# Patient Record
Sex: Female | Born: 1985 | Race: White | Hispanic: No | Marital: Married | Smoking: Never smoker
Health system: Southern US, Community
[De-identification: ages and names within clinical notes are randomized; demographics above are authoritative.]

## PROBLEM LIST (undated history)

## (undated) DIAGNOSIS — R87619 Unspecified abnormal cytological findings in specimens from cervix uteri: Secondary | ICD-10-CM

## (undated) DIAGNOSIS — IMO0002 Reserved for concepts with insufficient information to code with codable children: Secondary | ICD-10-CM

## (undated) DIAGNOSIS — Z8619 Personal history of other infectious and parasitic diseases: Secondary | ICD-10-CM

## (undated) HISTORY — PX: WISDOM TOOTH EXTRACTION: SHX21

## (undated) HISTORY — PX: MANDIBLE SURGERY: SHX707

## (undated) HISTORY — PX: OTHER SURGICAL HISTORY: SHX169

---
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
## 2005-07-16 ENCOUNTER — Other Ambulatory Visit: Admission: RE | Admit: 2005-07-16 | Discharge: 2005-07-16 | Payer: Self-pay | Admitting: Obstetrics and Gynecology

## 2012-12-16 NOTE — L&D Delivery Note (Signed)
[facility] Delivery Note  At 4:11 PM a viable and healthy female Stacy Morales) was delivered via NSVD (Presentation: OA).  APGAR: , ; weight .   Placenta status:intact. Three vessel cord:  with the following complications: none .  Anesthesia:  1% lidocaine Episiotomy:  none Lacerations: Second degree midline Suture Repair: 2.0 vicryl Est. Blood Loss (mL): 400 cc's.   Mom to postpartum.   Baby A to Skin to skin.   Baby B to NA.  Kirkland Hun V 08/26/2013, 5:05 PM

## 2013-03-16 LAB — OB RESULTS CONSOLE ABO/RH: RH Type: POSITIVE

## 2013-03-16 LAB — OB RESULTS CONSOLE HEPATITIS B SURFACE ANTIGEN: Hepatitis B Surface Ag: NEGATIVE

## 2013-03-16 LAB — OB RESULTS CONSOLE ANTIBODY SCREEN: Antibody Screen: NEGATIVE

## 2013-08-24 ENCOUNTER — Telehealth (HOSPITAL_COMMUNITY): Payer: Self-pay | Admitting: *Deleted

## 2013-08-24 ENCOUNTER — Encounter (HOSPITAL_COMMUNITY): Payer: Self-pay | Admitting: *Deleted

## 2013-08-24 NOTE — Telephone Encounter (Signed)
Preadmission screen  

## 2013-08-26 ENCOUNTER — Encounter (HOSPITAL_COMMUNITY): Payer: Self-pay | Admitting: *Deleted

## 2013-08-26 ENCOUNTER — Inpatient Hospital Stay (HOSPITAL_COMMUNITY)
Admission: AD | Admit: 2013-08-26 | Discharge: 2013-08-26 | Disposition: A | Discharge status: Home / Self Care | Payer: Federal, State, Local not specified - PPO | Source: Ambulatory Visit | Attending: Obstetrics and Gynecology | Admitting: Obstetrics and Gynecology

## 2013-08-26 ENCOUNTER — Inpatient Hospital Stay (HOSPITAL_COMMUNITY)
Admission: AD | Admit: 2013-08-26 | Discharge: 2013-08-27 | DRG: 373 | Disposition: A | Discharge status: Home / Self Care | Payer: Federal, State, Local not specified - PPO | Source: Ambulatory Visit | Attending: Obstetrics and Gynecology | Admitting: Obstetrics and Gynecology

## 2013-08-26 DIAGNOSIS — K649 Unspecified hemorrhoids: Secondary | ICD-10-CM | POA: Diagnosis present

## 2013-08-26 DIAGNOSIS — O878 Other venous complications in the puerperium: Secondary | ICD-10-CM | POA: Diagnosis present

## 2013-08-26 DIAGNOSIS — O479 False labor, unspecified: Secondary | ICD-10-CM | POA: Insufficient documentation

## 2013-08-26 HISTORY — DX: Reserved for concepts with insufficient information to code with codable children: IMO0002

## 2013-08-26 HISTORY — DX: Unspecified abnormal cytological findings in specimens from cervix uteri: R87.619

## 2013-08-26 HISTORY — DX: Personal history of other infectious and parasitic diseases: Z86.19

## 2013-08-26 LAB — ABO/RH: ABO/RH(D): O POS

## 2013-08-26 LAB — CBC
HCT: 36.9 % (ref 36.0–46.0)
RDW: 12.8 % (ref 11.5–15.5)
WBC: 17.8 10*3/uL — ABNORMAL HIGH (ref 4.0–10.5)

## 2013-08-26 MED ORDER — LACTATED RINGERS IV SOLN: 500.0000 mL | INTRAVENOUS | Status: DC | PRN

## 2013-08-26 MED ORDER — MEDROXYPROGESTERONE ACETATE 150 MG/ML IM SUSP: 150.0000 mg | INTRAMUSCULAR | Status: DC | PRN

## 2013-08-26 MED ORDER — LACTATED RINGERS IV SOLN: 500.0000 mL | Freq: Once | INTRAVENOUS | Status: DC

## 2013-08-26 MED ORDER — DIPHENHYDRAMINE HCL 50 MG/ML IJ SOLN: 12.5000 mg | INTRAMUSCULAR | Status: DC | PRN

## 2013-08-26 MED ORDER — EPHEDRINE 5 MG/ML INJ
10.0000 mg | INTRAVENOUS | Status: DC | PRN
  Filled 2013-08-26: qty 2

## 2013-08-26 MED ORDER — OXYTOCIN BOLUS FROM INFUSION: 500.0000 mL | INTRAVENOUS | Status: DC

## 2013-08-26 MED ORDER — BENZOCAINE-MENTHOL 20-0.5 % EX AERO
1.0000 "application " | INHALATION_SPRAY | CUTANEOUS | Status: DC | PRN
  Administered 2013-08-26 – 2013-08-27 (×2): 1 via TOPICAL
  Filled 2013-08-26 (×2): qty 56

## 2013-08-26 MED ORDER — TETANUS-DIPHTH-ACELL PERTUSSIS 5-2.5-18.5 LF-MCG/0.5 IM SUSP
0.5000 mL | Freq: Once | INTRAMUSCULAR | Status: AC
  Administered 2013-08-27: 0.5 mL via INTRAMUSCULAR

## 2013-08-26 MED ORDER — ACETAMINOPHEN 325 MG PO TABS: 650.0000 mg | ORAL_TABLET | ORAL | Status: DC | PRN

## 2013-08-26 MED ORDER — OXYTOCIN 10 UNIT/ML IJ SOLN
INTRAMUSCULAR | Status: AC
  Administered 2013-08-26: 10 [IU]
  Filled 2013-08-26: qty 2

## 2013-08-26 MED ORDER — ONDANSETRON HCL 4 MG/2ML IJ SOLN: 4.0000 mg | Freq: Four times a day (QID) | INTRAMUSCULAR | Status: DC | PRN

## 2013-08-26 MED ORDER — ZOLPIDEM TARTRATE 5 MG PO TABS: 5.0000 mg | ORAL_TABLET | Freq: Every evening | ORAL | Status: DC | PRN

## 2013-08-26 MED ORDER — MEASLES, MUMPS & RUBELLA VAC ~~LOC~~ INJ: 0.5000 mL | INJECTION | Freq: Once | SUBCUTANEOUS | Status: DC

## 2013-08-26 MED ORDER — SENNOSIDES-DOCUSATE SODIUM 8.6-50 MG PO TABS
2.0000 | ORAL_TABLET | ORAL | Status: DC
  Administered 2013-08-27: 2 via ORAL

## 2013-08-26 MED ORDER — BUTORPHANOL TARTRATE 1 MG/ML IJ SOLN: 1.0000 mg | INTRAMUSCULAR | Status: DC | PRN

## 2013-08-26 MED ORDER — PHENYLEPHRINE 40 MCG/ML (10ML) SYRINGE FOR IV PUSH (FOR BLOOD PRESSURE SUPPORT)
80.0000 ug | PREFILLED_SYRINGE | INTRAVENOUS | Status: DC | PRN
  Filled 2013-08-26: qty 2

## 2013-08-26 MED ORDER — WITCH HAZEL-GLYCERIN EX PADS
1.0000 "application " | MEDICATED_PAD | CUTANEOUS | Status: DC | PRN
  Administered 2013-08-26: 1 via TOPICAL

## 2013-08-26 MED ORDER — OXYCODONE-ACETAMINOPHEN 5-325 MG PO TABS: 1.0000 | ORAL_TABLET | ORAL | Status: DC | PRN

## 2013-08-26 MED ORDER — ONDANSETRON HCL 4 MG PO TABS
8.0000 mg | ORAL_TABLET | Freq: Once | ORAL | Status: AC
  Administered 2013-08-26: 8 mg via ORAL
  Filled 2013-08-26: qty 1
  Filled 2013-08-26: qty 2

## 2013-08-26 MED ORDER — OXYTOCIN 10 UNIT/ML IJ SOLN: 10.0000 [IU] | Freq: Once | INTRAMUSCULAR | Status: DC

## 2013-08-26 MED ORDER — ONDANSETRON HCL 4 MG PO TABS: 4.0000 mg | ORAL_TABLET | ORAL | Status: DC | PRN

## 2013-08-26 MED ORDER — LANOLIN HYDROUS EX OINT: TOPICAL_OINTMENT | CUTANEOUS | Status: DC | PRN

## 2013-08-26 MED ORDER — IBUPROFEN 600 MG PO TABS
600.0000 mg | ORAL_TABLET | Freq: Four times a day (QID) | ORAL | Status: DC
  Administered 2013-08-27 (×3): 600 mg via ORAL
  Filled 2013-08-26: qty 1

## 2013-08-26 MED ORDER — FENTANYL 2.5 MCG/ML BUPIVACAINE 1/10 % EPIDURAL INFUSION (WH - ANES): 14.0000 mL/h | INTRAMUSCULAR | Status: DC | PRN

## 2013-08-26 MED ORDER — LACTATED RINGERS IV SOLN: INTRAVENOUS | Status: DC

## 2013-08-26 MED ORDER — DIBUCAINE 1 % RE OINT
1.0000 "application " | TOPICAL_OINTMENT | RECTAL | Status: DC | PRN
  Administered 2013-08-26: 1 via RECTAL
  Filled 2013-08-26: qty 28

## 2013-08-26 MED ORDER — INFLUENZA VAC SPLIT QUAD 0.5 ML IM SUSP
0.5000 mL | INTRAMUSCULAR | Status: AC
  Administered 2013-08-27: 0.5 mL via INTRAMUSCULAR

## 2013-08-26 MED ORDER — SIMETHICONE 80 MG PO CHEW: 80.0000 mg | CHEWABLE_TABLET | ORAL | Status: DC | PRN

## 2013-08-26 MED ORDER — ONDANSETRON HCL 4 MG/2ML IJ SOLN: 4.0000 mg | INTRAMUSCULAR | Status: DC | PRN

## 2013-08-26 MED ORDER — LIDOCAINE HCL (PF) 1 % IJ SOLN
30.0000 mL | INTRAMUSCULAR | Status: AC | PRN
  Administered 2013-08-26 (×2): 30 mL via SUBCUTANEOUS
  Filled 2013-08-26 (×2): qty 30

## 2013-08-26 MED ORDER — CITRIC ACID-SODIUM CITRATE 334-500 MG/5ML PO SOLN: 30.0000 mL | ORAL | Status: DC | PRN

## 2013-08-26 MED ORDER — OXYTOCIN 40 UNITS IN LACTATED RINGERS INFUSION - SIMPLE MED
62.5000 mL/h | INTRAVENOUS | Status: DC
  Filled 2013-08-26: qty 1000

## 2013-08-26 MED ORDER — PRENATAL MULTIVITAMIN CH
1.0000 | ORAL_TABLET | Freq: Every day | ORAL | Status: DC
  Administered 2013-08-27: 1 via ORAL
  Filled 2013-08-26: qty 1

## 2013-08-26 MED ORDER — DIPHENHYDRAMINE HCL 25 MG PO CAPS: 25.0000 mg | ORAL_CAPSULE | Freq: Four times a day (QID) | ORAL | Status: DC | PRN

## 2013-08-26 MED ORDER — IBUPROFEN 600 MG PO TABS
600.0000 mg | ORAL_TABLET | Freq: Four times a day (QID) | ORAL | Status: DC | PRN
  Administered 2013-08-26: 600 mg via ORAL
  Filled 2013-08-26 (×4): qty 1

## 2013-08-26 NOTE — Progress Notes (Signed)
Pt instructed by Dr. Stefano Gaul to go walk around in [place] & come back in 2 hours if uc's continue to become more intense, may go home if uc's become less frequent.  Pt verbalizes understanding.

## 2013-08-26 NOTE — MAU Provider Note (Signed)
  History     CSN: [id number]  Arrival date and time: 08/26/13 8295   First Provider Initiated Contact with Patient 08/26/13 [phone]      No chief complaint on file.  HPI Comments: Pt is a G1P0 at [redacted]w[redacted]d that arrives for labor check, ctx since about 2am, now more painful and regular. Denies any VB or LOF, +FM. Pt c/o nausea, desires WB. Doula is pt's mom "Alice"      Past Medical History  Diagnosis Date  . Medical history non-contributory     Past Surgical History  Procedure Laterality Date  . Shoulder laparoscopic surgery      right  . Mandible surgery      Family History  Problem Relation Age of Onset  . Diabetes Maternal Grandfather   . Heart disease Paternal Grandfather   . Stroke Paternal Grandfather   . Alcohol abuse Neg Hx   . Arthritis Neg Hx   . Asthma Neg Hx   . Birth defects Neg Hx   . Cancer Neg Hx   . COPD Neg Hx   . Depression Neg Hx   . Drug abuse Neg Hx   . Early death Neg Hx   . Hearing loss Neg Hx   . Hyperlipidemia Neg Hx   . Hypertension Neg Hx   . Kidney disease Neg Hx   . Learning disabilities Neg Hx   . Mental illness Neg Hx   . Mental retardation Neg Hx   . Miscarriages / Stillbirths Neg Hx   . Vision loss Neg Hx     History  Substance Use Topics  . Smoking status: Never Smoker   . Smokeless tobacco: Never Used  . Alcohol Use: No    Allergies: No Known Allergies  Prescriptions prior to admission  Medication Sig Dispense Refill  . Doxylamine-Pyridoxine (DICLEGIS PO) Take by mouth.      . Multiple Vitamin (MULTIVITAMIN) tablet Take 1 tablet by mouth daily.        Review of Systems  Gastrointestinal: Positive for nausea.  All other systems reviewed and are negative.   Physical Exam   Blood pressure 110/69, pulse 114, temperature 98.4 F (36.9 C), temperature source Oral, resp. rate 20, height 5\' 6"  (1.676 m), weight 160 lb (72.576 kg), last menstrual period 11/09/2012.  Physical Exam  Nursing note and vitals  reviewed. Constitutional: She is oriented to person, place, and time. She appears well-developed and well-nourished.  HENT:  Head: Normocephalic.  Eyes: Pupils are equal, round, and reactive to light.  Neck: Normal range of motion.  Cardiovascular: Normal rate, regular rhythm and normal heart sounds.   Respiratory: Effort normal and breath sounds normal.  GI: Soft. Bowel sounds are normal.  Genitourinary: Vagina normal.  Musculoskeletal: Normal range of motion.  Neurological: She is alert and oriented to person, place, and time. She has normal reflexes.  Skin: Skin is warm and dry.  Psychiatric: She has a normal mood and affect. Her behavior is normal.    MAU Course  Procedures    Assessment and Plan  IUP at [redacted]w[redacted]d Prodrome vs. Early labor FHR reassuring GBS neg  Desires WB   Will give zofran 8mg  PO  Pt to rest/ambulate x1-2hrs and then recheck cervix Will inform Dr Stefano Gaul to take over care   Graeson Nouri M 08/26/2013, 6:49 AM

## 2013-08-26 NOTE — Progress Notes (Signed)
Apgar Scores: 8/9.  Dr. Stefano Gaul

## 2013-08-26 NOTE — MAU Note (Signed)
PT SAYS SHE HAS BEEN HURT BAD SINCE 0150.   VE IN OFFICE YESTERDAY-  1 CM .   DENIES HSV AND MRSA.

## 2013-08-26 NOTE — H&P (Signed)
Admission History and Physical Exam for an Obstetrics Patient  Stacy Morales is a 27 y.o. female, G1P0, at [redacted]w[redacted]d gestation, who presents for labor. She has been followed at the [facility] and Gynecology division of [facility].  Her pregnancy has been complicated by nausea. See history below.  OB History   Grav Para Term Preterm Abortions TAB SAB Ect Mult Living   1               Past Medical History  Diagnosis Date  . Medical history non-contributory     Prescriptions prior to admission  Medication Sig Dispense Refill  . Doxylamine-Pyridoxine (DICLEGIS PO) Take by mouth.      . Multiple Vitamin (MULTIVITAMIN) tablet Take 1 tablet by mouth daily.        Past Surgical History  Procedure Laterality Date  . Shoulder laparoscopic surgery      right  . Mandible surgery      No Known Allergies  Family History: family history includes Diabetes in her maternal grandfather; Heart disease in her paternal grandfather; Stroke in her paternal grandfather. There is no history of Alcohol abuse, Arthritis, Asthma, Birth defects, Cancer, COPD, Depression, Drug abuse, Early death, Hearing loss, Hyperlipidemia, Hypertension, Kidney disease, Learning disabilities, Mental illness, Mental retardation, Miscarriages / Stillbirths, or Vision loss.  Social History:  reports that she has never smoked. She has never used smokeless tobacco. She reports that she does not drink alcohol or use illicit drugs.  Review of systems: Normal pregnancy complaints.  Admission Physical Exam:   Body mass index is 25.84 kg/(m^2).  Blood pressure 119/79, pulse 64, temperature 99 F (37.2 C), temperature source Oral, resp. rate 18, height 5\' 6"  (1.676 m), weight 160 lb (72.576 kg), last menstrual period 11/09/2012.  HEENT:                 Within normal limits Chest:                   Clear Heart:                    Regular rate and rhythm Abdomen:             Gravid and  nontender Extremities:          Grossly normal Neurologic exam: Grossly normal CX:                       4 cm by the MAU nurse  NST: Cat 1  Prenatal labs: ABO, Rh:             O/Positive/-- (04/01 0000) HBsAg:                 Negative (04/01 0000)  HIV:                       Non-reactive (04/01 0000)  GBS:                     Negative (09/09 0000) Antibody:              Negative (04/01 0000) Rubella:                 Immune RPR:                    Nonreactive (04/01 0000)      Prenatal Transfer Tool  Maternal Diabetes:  No Genetic Screening: Declined Maternal Ultrasounds/Referrals: Normal Fetal Ultrasounds or other Referrals:  Other:  None Maternal Substance Abuse:  No Significant Maternal Medications:  None Significant Maternal Lab Results:  None Other Comments:  None  Assessment:  [redacted]w[redacted]d gestation  Labor  Plan:  Water birth.   Ailsa Mireles V 08/26/2013, 1:12 PM

## 2013-08-26 NOTE — MAU Note (Signed)
Pt here earlier for labor check. Ctx stronger closer together. Pt more uncomfortable

## 2013-08-26 NOTE — Consult Note (Signed)
DATE: 08/26/2013  Maternity Admissions Unit Consult Exam for an Obstetrics Patient  Ms. Stacy Morales is a 27 y.o. female, G1P0, at [redacted]w[redacted]d gestation, who presents for evaluation of labor. She has been followed at the [facility] and Gynecology division of [facility]. The patient has had irregular contractions. Her cervix was 1 cm dilated when evaluated this morning. See history below.  OB History   Grav Para Term Preterm Abortions TAB SAB Ect Mult Living   1               Past Medical History  Diagnosis Date  . Medical history non-contributory     Prescriptions prior to admission  Medication Sig Dispense Refill  . Doxylamine-Pyridoxine (DICLEGIS PO) Take by mouth.      . Multiple Vitamin (MULTIVITAMIN) tablet Take 1 tablet by mouth daily.        Past Surgical History  Procedure Laterality Date  . Shoulder laparoscopic surgery      right  . Mandible surgery      No Known Allergies  Family History: family history includes Diabetes in her maternal grandfather; Heart disease in her paternal grandfather; Stroke in her paternal grandfather. There is no history of Alcohol abuse, Arthritis, Asthma, Birth defects, Cancer, COPD, Depression, Drug abuse, Early death, Hearing loss, Hyperlipidemia, Hypertension, Kidney disease, Learning disabilities, Mental illness, Mental retardation, Miscarriages / Stillbirths, or Vision loss.  Social History:  reports that she has never smoked. She has never used smokeless tobacco. She reports that she does not drink alcohol or use illicit drugs.  Review of systems: Normal pregnancy complaints.  Admission Physical Exam:  Dilation: 2 Effacement (%): 50 Station: -3 Exam by:: Dr. Stefano Gaul Body mass index is 25.84 kg/(m^2).  Blood pressure 110/69, pulse 114, temperature 98.4 F (36.9 C), temperature source Oral, resp. rate 20, height 5\' 6"  (1.676 m), weight 160 lb (72.576 kg), last menstrual period  11/09/2012.   Prenatal labs: ABO, Rh:             O/Positive/-- (04/01 0000) Antibody:              Negative (04/01 0000) Rubella:                  RPR:                    Nonreactive (04/01 0000)  HBsAg:                 Negative (04/01 0000)  HIV:                       Non-reactive (04/01 0000)  GBS:                     Negative (09/09 0000)  NST: Category 1; Contractions: Irregular .   Assessment:  [redacted]w[redacted]d gestation  Contractions  Plan:  The patient will ambulate for several hours. She will then return for reevaluation. The patient should call for bleeding or other difficulties.   Luz Mares V 08/26/2013, 9:14 AM

## 2013-08-27 LAB — CBC
HCT: 32.9 % — ABNORMAL LOW (ref 36.0–46.0)
MCHC: 36.8 g/dL — ABNORMAL HIGH (ref 30.0–36.0)
MCV: 92.7 fL (ref 78.0–100.0)
Platelets: 194 10*3/uL (ref 150–400)
RDW: 13 % (ref 11.5–15.5)
WBC: 14.6 10*3/uL — ABNORMAL HIGH (ref 4.0–10.5)

## 2013-08-27 MED ORDER — IBUPROFEN 600 MG PO TABS: 600.0000 mg | ORAL_TABLET | Freq: Four times a day (QID) | ORAL | Status: DC | PRN

## 2013-08-27 MED ORDER — OXYCODONE-ACETAMINOPHEN 5-325 MG PO TABS: 1.0000 | ORAL_TABLET | Freq: Four times a day (QID) | ORAL | Status: DC | PRN

## 2013-08-27 MED ORDER — HYDROCORTISONE ACE-PRAMOXINE 1-1 % RE FOAM: 1.0000 | Freq: Two times a day (BID) | RECTAL | Status: DC

## 2013-08-27 NOTE — Discharge Summary (Signed)
Obstetric Discharge Summary Reason for Admission: onset of labor Prenatal Procedures: ultrasound Intrapartum Procedures: spontaneous vaginal delivery Postpartum Procedures: none Complications-Operative and Postpartum: none Hemoglobin  Date Value Range Status  08/27/2013 12.1  12.0 - 15.0 g/dL Final     HCT  Date Value Range Status  08/27/2013 32.9* 36.0 - 46.0 % Final   Pt would like discharge today if baby is discharged which she anticipates.  Pt is doing well.  Only complaint is about her hemorrhoids.  She requests proctofoam.  Rx to be given.  Pt is BF'ing and declines BC secondary to the "difficulty" they had with conceiving.  Physical Exam:  General: alert and no distress Lochia: appropriate Uterine Fundus: firm Incision: dehiscence present n/a DVT Evaluation: No evidence of DVT seen on physical exam. Hemorrhoids are soft and minimal tenderness, does not have the appearance of thrombosis currently  Discharge Diagnoses: Term Pregnancy-delivered  Discharge Information: Date: 08/27/2013 Activity: pelvic rest Diet: routine Medications: Ibuprofen, Percocet and Proctofoam HC Condition: stable Instructions: refer to practice specific booklet Discharge to: home Follow-up Information   Follow up with Janine Limbo, MD In 6 weeks. (for post partum visit)    Specialty:  Obstetrics and Gynecology   Contact information:   [address] [place] [address] [phone]       Newborn Data: Live born female  Birth Weight: 9 lb 1.2 oz (4115 g) APGAR: 8, 9  Home with mother.  Purcell Nails 08/27/2013, 12:13 PM

## 2013-09-01 ENCOUNTER — Inpatient Hospital Stay (HOSPITAL_COMMUNITY): Admission: RE | Admit: 2013-09-01 | Payer: Federal, State, Local not specified - PPO | Source: Ambulatory Visit

## 2014-09-15 LAB — OB RESULTS CONSOLE HEPATITIS B SURFACE ANTIGEN: Hepatitis B Surface Ag: NEGATIVE

## 2014-09-15 LAB — OB RESULTS CONSOLE HIV ANTIBODY (ROUTINE TESTING): HIV: NONREACTIVE

## 2014-09-15 LAB — OB RESULTS CONSOLE RPR: RPR: NONREACTIVE

## 2014-09-15 LAB — OB RESULTS CONSOLE GC/CHLAMYDIA
CHLAMYDIA, DNA PROBE: NEGATIVE
GC PROBE AMP, GENITAL: NEGATIVE

## 2014-09-15 LAB — OB RESULTS CONSOLE RUBELLA ANTIBODY, IGM: RUBELLA: IMMUNE

## 2014-09-15 LAB — OB RESULTS CONSOLE ABO/RH: RH Type: POSITIVE

## 2014-09-15 LAB — OB RESULTS CONSOLE ANTIBODY SCREEN: Antibody Screen: NEGATIVE

## 2014-10-17 ENCOUNTER — Encounter (HOSPITAL_COMMUNITY): Payer: Self-pay | Admitting: *Deleted

## 2014-12-16 NOTE — L&D Delivery Note (Signed)
Delivery Note At 6:25 AM a viable female "Stacy Morales" was delivered via Vaginal, Spontaneous Delivery (Presentation: Occiput Anterior, restituting to LOA). APGARS: 8, 9; weight pending.   Placenta status: Intact, Spontaneous.  Cord: 3 vessels with the following complications: Marginal insertion.  Cord pH: NA  Anesthesia: Local for repair Episiotomy: None Lacerations: 2nd degree Suture Repair: 3.0 vicryl and SH Est. Blood Loss (mL): 280  Mom to postpartum.  Baby to Couplet care / Skin to Skin.  Mom plans to breastfeed.  Declines contraception.  Sherre ScarletWILLIAMS, Otisha Spickler 04/14/2015, 7:31 AM

## 2015-03-14 LAB — OB RESULTS CONSOLE GBS: GBS: NEGATIVE

## 2015-04-14 ENCOUNTER — Encounter (INDEPENDENT_AMBULATORY_CARE_PROVIDER_SITE_OTHER): Payer: Self-pay

## 2015-04-14 ENCOUNTER — Inpatient Hospital Stay (HOSPITAL_COMMUNITY)
Admission: AD | Admit: 2015-04-14 | Discharge: 2015-04-15 | DRG: 774 | Disposition: A | Discharge status: Home / Self Care | Payer: BLUE CROSS/BLUE SHIELD | Source: Ambulatory Visit | Attending: Obstetrics and Gynecology | Admitting: Obstetrics and Gynecology

## 2015-04-14 ENCOUNTER — Encounter (HOSPITAL_COMMUNITY): Payer: Self-pay | Admitting: *Deleted

## 2015-04-14 DIAGNOSIS — O2243 Hemorrhoids in pregnancy, third trimester: Secondary | ICD-10-CM | POA: Diagnosis present

## 2015-04-14 DIAGNOSIS — O4403 Placenta previa specified as without hemorrhage, third trimester: Secondary | ICD-10-CM | POA: Diagnosis present

## 2015-04-14 DIAGNOSIS — O9952 Diseases of the respiratory system complicating childbirth: Secondary | ICD-10-CM | POA: Diagnosis present

## 2015-04-14 DIAGNOSIS — Z3A40 40 weeks gestation of pregnancy: Secondary | ICD-10-CM | POA: Diagnosis present

## 2015-04-14 DIAGNOSIS — Z8619 Personal history of other infectious and parasitic diseases: Secondary | ICD-10-CM

## 2015-04-14 DIAGNOSIS — Z8742 Personal history of other diseases of the female genital tract: Secondary | ICD-10-CM

## 2015-04-14 DIAGNOSIS — S3023XA Contusion of vagina and vulva, initial encounter: Secondary | ICD-10-CM

## 2015-04-14 DIAGNOSIS — K645 Perianal venous thrombosis: Secondary | ICD-10-CM

## 2015-04-14 DIAGNOSIS — J302 Other seasonal allergic rhinitis: Secondary | ICD-10-CM | POA: Diagnosis present

## 2015-04-14 DIAGNOSIS — O480 Post-term pregnancy: Secondary | ICD-10-CM | POA: Diagnosis present

## 2015-04-14 DIAGNOSIS — IMO0002 Reserved for concepts with insufficient information to code with codable children: Secondary | ICD-10-CM

## 2015-04-14 LAB — CBC
HCT: 37.9 % (ref 36.0–46.0)
HEMOGLOBIN: 13.7 g/dL (ref 12.0–15.0)
MCH: 33.9 pg (ref 26.0–34.0)
MCHC: 36.1 g/dL — ABNORMAL HIGH (ref 30.0–36.0)
MCV: 93.8 fL (ref 78.0–100.0)
Platelets: 204 10*3/uL (ref 150–400)
RBC: 4.04 MIL/uL (ref 3.87–5.11)
RDW: 13.1 % (ref 11.5–15.5)
WBC: 15.1 10*3/uL — ABNORMAL HIGH (ref 4.0–10.5)

## 2015-04-14 LAB — TYPE AND SCREEN
ABO/RH(D): O POS
Antibody Screen: NEGATIVE

## 2015-04-14 MED ORDER — LACTATED RINGERS IV SOLN: 500.0000 mL | INTRAVENOUS | Status: DC | PRN

## 2015-04-14 MED ORDER — OXYTOCIN BOLUS FROM INFUSION: 500.0000 mL | INTRAVENOUS | Status: DC

## 2015-04-14 MED ORDER — LACTATED RINGERS IV SOLN
INTRAVENOUS | Status: DC
Start: 2015-04-14 — End: 2015-04-15

## 2015-04-14 MED ORDER — ZOLPIDEM TARTRATE 5 MG PO TABS: 5.0000 mg | ORAL_TABLET | Freq: Every evening | ORAL | Status: DC | PRN

## 2015-04-14 MED ORDER — IBUPROFEN 600 MG PO TABS
600.0000 mg | ORAL_TABLET | Freq: Four times a day (QID) | ORAL | Status: DC | PRN
  Administered 2015-04-14: 600 mg via ORAL
  Filled 2015-04-14: qty 1

## 2015-04-14 MED ORDER — NALBUPHINE HCL 10 MG/ML IJ SOLN: 5.0000 mg | INTRAMUSCULAR | Status: DC | PRN

## 2015-04-14 MED ORDER — TETANUS-DIPHTH-ACELL PERTUSSIS 5-2.5-18.5 LF-MCG/0.5 IM SUSP: 0.5000 mL | Freq: Once | INTRAMUSCULAR | Status: DC

## 2015-04-14 MED ORDER — DIBUCAINE 1 % RE OINT: 1.0000 "application " | TOPICAL_OINTMENT | RECTAL | Status: DC | PRN

## 2015-04-14 MED ORDER — SIMETHICONE 80 MG PO CHEW: 80.0000 mg | CHEWABLE_TABLET | ORAL | Status: DC | PRN

## 2015-04-14 MED ORDER — LIDOCAINE HCL (PF) 1 % IJ SOLN
30.0000 mL | INTRAMUSCULAR | Status: DC | PRN
  Filled 2015-04-14: qty 30

## 2015-04-14 MED ORDER — SENNOSIDES-DOCUSATE SODIUM 8.6-50 MG PO TABS: 2.0000 | ORAL_TABLET | ORAL | Status: DC

## 2015-04-14 MED ORDER — OXYTOCIN 10 UNIT/ML IJ SOLN
INTRAMUSCULAR | Status: AC
  Administered 2015-04-14: 10 [IU] via INTRAMUSCULAR
  Filled 2015-04-14: qty 1

## 2015-04-14 MED ORDER — OXYCODONE-ACETAMINOPHEN 5-325 MG PO TABS: 2.0000 | ORAL_TABLET | ORAL | Status: DC | PRN

## 2015-04-14 MED ORDER — PRENATAL MULTIVITAMIN CH: 1.0000 | ORAL_TABLET | Freq: Every day | ORAL | Status: DC

## 2015-04-14 MED ORDER — ACETAMINOPHEN 325 MG PO TABS: 650.0000 mg | ORAL_TABLET | ORAL | Status: DC | PRN

## 2015-04-14 MED ORDER — LANOLIN HYDROUS EX OINT: TOPICAL_OINTMENT | CUTANEOUS | Status: DC | PRN

## 2015-04-14 MED ORDER — ONDANSETRON HCL 4 MG/2ML IJ SOLN: 4.0000 mg | INTRAMUSCULAR | Status: DC | PRN

## 2015-04-14 MED ORDER — OXYTOCIN 40 UNITS IN LACTATED RINGERS INFUSION - SIMPLE MED
62.5000 mL/h | INTRAVENOUS | Status: DC
Start: 2015-04-14 — End: 2015-04-15

## 2015-04-14 MED ORDER — OXYCODONE-ACETAMINOPHEN 5-325 MG PO TABS: 1.0000 | ORAL_TABLET | ORAL | Status: DC | PRN

## 2015-04-14 MED ORDER — ONDANSETRON HCL 4 MG/2ML IJ SOLN: 4.0000 mg | Freq: Four times a day (QID) | INTRAMUSCULAR | Status: DC | PRN

## 2015-04-14 MED ORDER — CITRIC ACID-SODIUM CITRATE 334-500 MG/5ML PO SOLN: 30.0000 mL | ORAL | Status: DC | PRN

## 2015-04-14 MED ORDER — DIPHENHYDRAMINE HCL 25 MG PO CAPS: 25.0000 mg | ORAL_CAPSULE | Freq: Four times a day (QID) | ORAL | Status: DC | PRN

## 2015-04-14 MED ORDER — FLEET ENEMA 7-19 GM/118ML RE ENEM: 1.0000 | ENEMA | RECTAL | Status: DC | PRN

## 2015-04-14 MED ORDER — ONDANSETRON HCL 4 MG PO TABS: 4.0000 mg | ORAL_TABLET | ORAL | Status: DC | PRN

## 2015-04-14 MED ORDER — LIDOCAINE HCL (PF) 1 % IJ SOLN
INTRAMUSCULAR | Status: AC
  Administered 2015-04-14: 30 mL
  Filled 2015-04-14: qty 30

## 2015-04-14 MED ORDER — BENZOCAINE-MENTHOL 20-0.5 % EX AERO: 1.0000 "application " | INHALATION_SPRAY | CUTANEOUS | Status: DC | PRN

## 2015-04-14 MED ORDER — WITCH HAZEL-GLYCERIN EX PADS: 1.0000 "application " | MEDICATED_PAD | CUTANEOUS | Status: DC | PRN

## 2015-04-14 NOTE — H&P (Signed)
Stacy Morales is a 29 y.o. female, G2P1001 at 40.3 weeks admitted in active labor.   Pt desires NCB and has been ambulating by choice for ~ 2 1/2 hrs. Just prior to admission, she reported an increase in contraction pattern - now more frequent with greater intensity. Reports +FM. Denies VB or LOF.  Patient Active Problem List   Diagnosis Date Noted  . Thrombosed external hemorrhoid 04/14/2015  . Marginal insertion of umbilical cord 04/14/2015  . Perineal hematoma - after first delivery 04/14/2015  . NSVD (normal spontaneous vaginal delivery) 04/14/2015  . Normal labor 04/14/2015  . Seasonal allergies 04/14/2015  . History of abnormal cervical Pap smear in 2013 - normal colpo 04/14/2015  . 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 LGA (large for gestational age) fetus with first baby (9 lbs 1 oz) 04/14/2015    History of present pregnancy: Patient entered care at 12.2 weeks.   EDC of 04/11/15 was established by uncertain LMP, yet c/w 12.1 wk sono.   Anatomy scan:  20.2 weeks, with normal anatomy, anterior placenta with marginal insertion of cord - repeat at 28 wks.   Additional US evaluations: 12.2 wks - dating: Anteverted uterus. [place] [id number] IUP. Ovaries and adnexas WNLs. Dates concordant. 28.3 wks for growth and MI of cord: Vertex. Anterior placenta. Normal fluid. AFI of 14.7 cm = 50%tile. Placenta cord insertion seen, appears stable - measurement of placenta edge to uterine wall = 2.9 cm. EFW 46th%tile. Cvx closed.   36.6 wks for growth (S<D) and MI of cord: Vertex. Anterior placenta. Placental cord insertion not seen on this study. Normal fluid. AFI 13.7 cm = 50%tile. EFW 34th%tile. Cervix not measured.   Significant prenatal events: Unplanned pregnancy but very much desired. Used breastfeeding as means of contraception, became pregnant upon discontinuation. Nausea in first trimester relieved by Diclegis. Yeast infection also in first  trimester - treated/relieved w/ Terazol cream. Round ligament pain in 2nd trimester - reviewed as a common discomfort of pregnancy; conservative measures also reviewed. Struggled w/ canker sores on tongue at ~ 20 wks - used Triamcinolone ointment prescribed by Dentist. Marginal insertion of cord - elected as few ultrasounds as possible due to high u/s copay - accepted risk of potentially missing IUGR. Contractions at 36+ wks - cvx 0/20%/-3. Seasonal allergies treated w/ Zyrtec. Partially thrombosed hemorrhoid - treated w/ Analpram-HC and Proctofoam. Contractions at 39 wks - membranes swept; cvx 1/50/-1. TWG 18 lbs; normal BMI. Received flu vaccine on 10/24/14. Declined Tdap.   Last evaluation: Office 04/12/15 @ 40.1 wks by Dr. Estanislado Morales. Cvx 1/50/-2. BP 120/58.  OB History    Gravida Para Term Preterm AB TAB SAB Ectopic Multiple Living   2 2 2       0 2    08/26/2013 @ 40.5 wks, female infant, birthwt 9+1, WHG, NCB, delivered by Dr. Stefano Morales - "Stacy Morales"; suffered perineal hematoma - noted 4 days post delivery. Past Medical History  Diagnosis Date  . Abnormal Pap smear     colpo 09/15/2012  . History of HPV infection    Past Surgical History  Procedure Laterality Date  . Shoulder laparoscopic surgery      right  . Mandible surgery    . Wisdom tooth extraction     Family History: family history includes Diabetes in her maternal grandfather; Heart disease in her paternal grandfather; Stroke in her paternal grandfather. There is no history of Alcohol abuse, Arthritis, Asthma, Birth defects, Cancer,  COPD, Depression, Drug abuse, Early death, Hearing loss, Hyperlipidemia, Hypertension, Kidney disease, Learning disabilities, Mental illness, Mental retardation, Miscarriages / Stillbirths, or Vision loss.Hypothyroidism in her MGM, tumor of bladder in her MGF and Alzheimer's in her PGM. Social History:  reports that she has never smoked. She has never used smokeless tobacco. She reports that she does not drink  alcohol or use illicit drugs.Pt is Caucasian w/ a post graduate degree, employed as an Sport and exercise psychologist. She is married to Stacy Morales who is present and supportive. Pt is of the [place] faith and will accept blood products in an emergency.   Prenatal Transfer Tool  Maternal Diabetes: No Genetic Screening: Declined Maternal Ultrasounds/Referrals: Abnormal:  Findings:   Other:Marginal insertion of cord Fetal Ultrasounds or other Referrals:  None Maternal Substance Abuse:  No Significant Maternal Medications:  Meds include: Other: PNVs, Analpram-HC 1% rectal cream prn, Proctofoam HC 1% prn Significant Maternal Lab Results: Lab values include: Group B Strep negative  TDAP: NA Flu: 10/24/2014  ROS: +FM, +ctxs, -VB, -LOF  No Known Allergies   Dilation: 10 Effacement (%): 100 Station: 0 Exam by:: Stacy Morales, CNM Blood pressure 103/69, pulse 57, temperature 97.9 F (36.6 C), temperature source Oral, resp. rate 20, unknown if currently breastfeeding.  Chest clear Heart RRR without murmur Abd gravid, soft, NT, FH CWD Pelvic: 5/90/-2; 3/50/-2 on initial presentation; rapid cervical progression once admitted Cephalic by Leopolds EFW: 7 1/2 lbs Ext: WNL  FHR: BL 140 w/ moderate variability, +accels, no decels UCs: q 2 min  Prenatal labs: ABO, Rh: --/--/O POS (04/29 0915) Antibody: NEG (04/29 0915) Rubella: Immune RPR: Non Reactive (04/29 0915)  HBsAg: Negative (10/01 0000)  HIV: Non-reactive (10/01 0000)  GBS: Negative (03/29 0000) Sickle cell/Hgb electrophoresis: NA Pap: Abnormal 09/2012; normal colpo GC: Neg on 09/15/14 Chlamydia: Neg on 09/15/14 Genetic screenings: Declined Glucola: Normal at 105  Hgb 13.1 at NOB, 12.1 at 28 weeks  Assessment: IUP at 40.3 wks Active labor GBS neg Cat 1 FHRT Partially thrombosed external hemorrhoid Previous LGA infant (9+1) Marginal cord insertion - normal growth (EFW 34%tile)/AFI at 36.6 wks Seasonal allergies  Plan: Admit to [employer] Routine [facility] orders Pain med as desired; however, planning NCB Consult prn Anticipate progress and SVD   Stacy Askew, MS 04/14/15, 05:38 AM

## 2015-04-14 NOTE — MAU Note (Signed)
Pt returns from ambulating at 0240, provider in to do SVE, states in room with pt to dicuss plan of care. Pt does not want meds at this time, desires to walk again. No EFM at this time per provider

## 2015-04-14 NOTE — MAU Note (Signed)
PT  SAYS SHE  STARTED HURTING  BAD AT 10PM.    VE IN OFFICE  2 CM.   DENIES HSV AND MRSA.  GBS- NEG.   UNSURE  IF SROM-  THINKS  MUCUS.

## 2015-04-14 NOTE — MAU Note (Signed)
Pt states she has been contracting about q5 min

## 2015-04-14 NOTE — Lactation Note (Signed)
This note was copied from the chart of Stacy Resa [place]. Lactation Consultation Note; Experienced BF mom reports baby has already nursed 4 times with good latch. Baby asleep skin to skin with mom at present. Reviewed feeding cues and cluster feeding with parents.Dad asking how to know if baby is getting enough- reviewed with parents. BF brochure given with resources for support after DC.  No further questions at present. To call for assist prn  Patient Name: Stacy Loree FeeCharlotte Athens [id number]'s Date: 04/14/2015 Reason for consult: Initial assessment   Maternal Data Formula Feeding for Exclusion: No Does the patient have breastfeeding experience prior to this delivery?: Yes  Feeding    LATCH Score/Interventions                      Lactation Tools Discussed/Used     Consult Status Consult Status: PRN    Pamelia HoitWeeks, Kori Goins D 04/14/2015, 1:45 PM

## 2015-04-14 NOTE — Progress Notes (Signed)
Lab called to come draw blood work since patient did not get an IV

## 2015-04-15 LAB — CBC
HEMATOCRIT: 34.5 % — AB (ref 36.0–46.0)
Hemoglobin: 12.2 g/dL (ref 12.0–15.0)
MCH: 33.8 pg (ref 26.0–34.0)
MCHC: 35.4 g/dL (ref 30.0–36.0)
MCV: 95.6 fL (ref 78.0–100.0)
Platelets: 167 10*3/uL (ref 150–400)
RBC: 3.61 MIL/uL — AB (ref 3.87–5.11)
RDW: 13.5 % (ref 11.5–15.5)
WBC: 8.5 10*3/uL (ref 4.0–10.5)

## 2015-04-15 LAB — RPR: RPR Ser Ql: NONREACTIVE

## 2015-04-15 NOTE — Lactation Note (Signed)
This note was copied from the chart of Stacy Allyssia Morawski. Lactation Consultation Note: Mother request LC for concerns . She states she has a tiny blister on the left nipple. She states that it is gone now. Tips given to prevent nipple pain. Advised mother to adjust infants lower jaw for wider gape and to flip infants top lip up. Mother declines need for comfort gels. Advised to feed infant 8-12 times in 24 hours. Mother is aware of LC services.   Patient Name: Stacy Morales [id number]'s Date: 04/15/2015 Reason for consult: Follow-up assessment   Maternal Data    Feeding Feeding Type: Breast Fed Length of feed: 15 min  LATCH Score/Interventions Latch: Grasps breast easily, tongue down, lips flanged, rhythmical sucking.  Audible Swallowing: A few with stimulation  Type of Nipple: Everted at rest and after stimulation  Comfort (Breast/Nipple): Soft / non-tender (on left nipple (states rt nipple sore))     Hold (Positioning): No assistance needed to correctly position infant at breast.  LATCH Score: 9  Lactation Tools Discussed/Used     Consult Status Consult Status: Complete    Michel BickersKendrick, Lianette Broussard McCoy 04/15/2015, 11:01 AM

## 2015-04-15 NOTE — Discharge Instructions (Signed)
Postpartum Care After Vaginal Delivery °After you deliver your newborn (postpartum period), the usual stay in the hospital is 24-72 hours. If there were problems with your labor or delivery, or if you have other medical problems, you might be in the hospital longer.  °While you are in the hospital, you will receive help and instructions on how to care for yourself and your newborn during the postpartum period.  °While you are in the hospital: °· Be sure to tell your nurses if you have pain or discomfort, as well as where you feel the pain and what makes the pain worse. °· If you had an incision made near your vagina (episiotomy) or if you had some tearing during delivery, the nurses may put ice packs on your episiotomy or tear. The ice packs may help to reduce the pain and swelling. °· If you are breastfeeding, you may feel uncomfortable contractions of your uterus for a couple of weeks. This is normal. The contractions help your uterus get back to normal size. °· It is normal to have some bleeding after delivery. °· For the first 1-3 days after delivery, the flow is red and the amount may be similar to a period. °· It is common for the flow to start and stop. °· In the first few days, you may pass some small clots. Let your nurses know if you begin to pass large clots or your flow increases. °· Do not  flush blood clots down the toilet before having the nurse look at them. °· During the next 3-10 days after delivery, your flow should become more watery and pink or brown-tinged in color. °· Ten to fourteen days after delivery, your flow should be a small amount of yellowish-white discharge. °· The amount of your flow will decrease over the first few weeks after delivery. Your flow may stop in 6-8 weeks. Most women have had their flow stop by 12 weeks after delivery. °· You should change your sanitary pads frequently. °· Wash your hands thoroughly with soap and water for at least 20 seconds after changing pads, using  the toilet, or before holding or feeding your newborn. °· You should feel like you need to empty your bladder within the first 6-8 hours after delivery. °· In case you become weak, lightheaded, or faint, call your nurse before you get out of bed for the first time and before you take a shower for the first time. °· Within the first few days after delivery, your breasts may begin to feel tender and full. This is called engorgement. Breast tenderness usually goes away within 48-72 hours after engorgement occurs. You may also notice milk leaking from your breasts. If you are not breastfeeding, do not stimulate your breasts. Breast stimulation can make your breasts produce more milk. °· Spending as much time as possible with your newborn is very important. During this time, you and your newborn can feel close and get to know each other. Having your newborn stay in your room (rooming in) will help to strengthen the bond with your newborn.  It will give you time to get to know your newborn and become comfortable caring for your newborn. °· Your hormones change after delivery. Sometimes the hormone changes can temporarily cause you to feel sad or tearful. These feelings should not last more than a few days. If these feelings last longer than that, you should talk to your caregiver. °· If desired, talk to your caregiver about methods of family planning or contraception. °·   Talk to your caregiver about immunizations. Your caregiver may want you to have the following immunizations before leaving the hospital:  Tetanus, diphtheria, and pertussis (Tdap) or tetanus and diphtheria (Td) immunization. It is very important that you and your family (including grandparents) or others caring for your newborn are up-to-date with the Tdap or Td immunizations. The Tdap or Td immunization can help protect your newborn from getting ill.  Rubella immunization.  Varicella (chickenpox) immunization.  Influenza immunization. You should  receive this annual immunization if you did not receive the immunization during your pregnancy. Document Released: 09/29/2007 Document Revised: 08/26/2012 Document Reviewed: 07/29/2012 [facility] Patient Information 2015 [employer] [place]. This information is not intended to replace advice given to you by your health care provider. Make sure you discuss any questions you have with your health care provider.  Hemorrhoids Hemorrhoids are swollen veins around the rectum or anus. There are two types of hemorrhoids:   Internal hemorrhoids. These occur in the veins just inside the rectum. They may poke through to the outside and become irritated and painful.  External hemorrhoids. These occur in the veins outside the anus and can be felt as a painful swelling or hard lump near the anus. CAUSES  Pregnancy.   Obesity.   Constipation or diarrhea.   Straining to have a bowel movement.   Sitting for long periods on the toilet.  Heavy lifting or other activity that caused you to strain.  Anal intercourse. SYMPTOMS   Pain.   Anal itching or irritation.   Rectal bleeding.   Fecal leakage.   Anal swelling.   One or more lumps around the anus.  DIAGNOSIS  Your caregiver may be able to diagnose hemorrhoids by visual examination. Other examinations or tests that may be performed include:   Examination of the rectal area with a gloved hand (digital rectal exam).   Examination of anal canal using a small tube (scope).   A blood test if you have lost a significant amount of blood.  A test to look inside the colon (sigmoidoscopy or colonoscopy). TREATMENT Most hemorrhoids can be treated at home. However, if symptoms do not seem to be getting better or if you have a lot of rectal bleeding, your caregiver may perform a procedure to help make the hemorrhoids get smaller or remove them completely. Possible treatments include:   Placing a rubber band at the base of the hemorrhoid to  cut off the circulation (rubber band ligation).   Injecting a chemical to shrink the hemorrhoid (sclerotherapy).   Using a tool to burn the hemorrhoid (infrared light therapy).   Surgically removing the hemorrhoid (hemorrhoidectomy).   Stapling the hemorrhoid to block blood flow to the tissue (hemorrhoid stapling).  HOME CARE INSTRUCTIONS   Eat foods with fiber, such as whole grains, beans, nuts, fruits, and vegetables. Ask your doctor about taking products with added fiber in them (fibersupplements).  Increase fluid intake. Drink enough water and fluids to keep your urine clear or pale yellow.   Exercise regularly.   Go to the bathroom when you have the urge to have a bowel movement. Do not wait.   Avoid straining to have bowel movements.   Keep the anal area dry and clean. Use wet toilet paper or moist towelettes after a bowel movement.   Medicated creams and suppositories may be used or applied as directed.   Only take over-the-counter or prescription medicines as directed by your caregiver.   Take warm sitz baths for 15-20 minutes, 3-4  times a day to ease pain and discomfort.   °· Place ice packs on the hemorrhoids if they are tender and swollen. Using ice packs between sitz baths may be helpful.   °¨ Put ice in a plastic bag.   °¨ Place a towel between your skin and the bag.   °¨ Leave the ice on for 15-20 minutes, 3-4 times a day.   °· Do not use a donut-shaped pillow or sit on the toilet for long periods. This increases blood pooling and pain.   °SEEK MEDICAL CARE IF: °· You have increasing pain and swelling that is not controlled by treatment or medicine. °· You have uncontrolled bleeding. °· You have difficulty or you are unable to have a bowel movement. °· You have pain or inflammation outside the area of the hemorrhoids. °MAKE SURE YOU: °· Understand these instructions. °· Will watch your condition. °· Will get help right away if you are not doing well or get  worse. °Document Released: 11/29/2000 Document Revised: 11/18/2012 Document Reviewed: 10/06/2012 °ExitCare® Patient Information ©2015 [employer], LLC. This information is not intended to replace advice given to you by your health care provider. Make sure you discuss any questions you have with your health care provider. ° °

## 2015-04-15 NOTE — Discharge Summary (Signed)
  Vaginal Delivery Discharge Summary  Stacy Morales  DOB:    03-04-1986 MRN:    [id number] CSN:    [id number]  Date of admission:                  04/14/15  Date of discharge:                   04/15/15  Procedures this admission:   SVB, repair of 2nd degree perineal laceration  Date of Delivery: 04/14/15  Newborn Data:  Live born female  Birth Weight: 7 lb 6.2 oz (3350 g) APGAR: 8, 9  Home with mother. Name: Stacy Morales   History of Present Illness:  Ms. Stacy MillinCharlotte I Funnell is a 29 y.o. female, G2P2002, who presents at 1651w3d weeks gestation. [facility] She was admitted onset of labor. Her pregnancy has been complicated by:  Patient Active Problem List   Diagnosis Date Noted  . Thrombosed external hemorrhoid 04/14/2015  . Marginal insertion of umbilical cord 04/14/2015  . Perineal hematoma 04/14/2015  . NSVD (normal spontaneous vaginal delivery) 04/14/2015  . Normal labor 04/14/2015     Hospital Course:  Admitted 04/14/15 in active labor. Negative GBS. Progressed spontaneously and rapidly. Utilized nothing for pain management.  Delivery was performed by Sherre ScarletKimberly Williams, CNM, without complication. Patient and baby tolerated the procedure without difficulty, with 2nd degree perineal laceration noted. Infant status was stable and remained in room with mother.  Mother and infant then had an uncomplicated postpartum course, with breastfeeding going well. Mom's physical exam was WNL, and she was discharged home in stable condition. Contraception plan is NFP.  She received adequate benefit from po pain medications, using only Motrin, declined Rx.   Feeding:  breast  Contraception:  natural family planning (NFP)  Hemoglobin Results:  @CBC3   Discharge Physical Exam:   General: alert Lochia: appropriate Uterine Fundus: firm Incision: healing well DVT Evaluation: No  evidence of DVT seen on physical exam. Negative Homan's sign.  Intrapartum Procedures: spontaneous vaginal delivery Postpartum Procedures: none Complications-Operative and Postpartum: 2nd degree perineal laceration  Discharge Diagnoses: Term Pregnancy-delivered  Discharge Information:  Activity:           pelvic rest Diet:                routine Medications: None Condition:      stable Instructions:  Normal pp    Discharge to: home  Follow-up Information    [facility] In 6 weeks.   Specialty:  Obstetrics and Gynecology   Why:  Call for any questions or concerns.  Call if hemorrhoid worsens.   Contact information:   [address] [place] [id number] [phone]       Nigel BridgemanLATHAM, Brnadon Eoff CNM 04/15/2015 10:20 AM

## 2016-05-22 LAB — OB RESULTS CONSOLE RUBELLA ANTIBODY, IGM: Rubella: IMMUNE

## 2016-05-22 LAB — OB RESULTS CONSOLE HIV ANTIBODY (ROUTINE TESTING): HIV: NONREACTIVE

## 2016-05-22 LAB — OB RESULTS CONSOLE ABO/RH: RH TYPE: POSITIVE

## 2016-05-22 LAB — OB RESULTS CONSOLE RPR: RPR: NONREACTIVE

## 2016-05-22 LAB — OB RESULTS CONSOLE GC/CHLAMYDIA
Chlamydia: NEGATIVE
GC PROBE AMP, GENITAL: NEGATIVE

## 2016-05-22 LAB — OB RESULTS CONSOLE HEPATITIS B SURFACE ANTIGEN: HEP B S AG: NEGATIVE

## 2016-05-22 LAB — OB RESULTS CONSOLE ANTIBODY SCREEN: ANTIBODY SCREEN: NEGATIVE

## 2016-12-16 NOTE — L&D Delivery Note (Signed)
Delivery Note At 5:21 PM a viable female was delivered via Vaginal, Spontaneous Delivery (Presentation: ;  ).  APGAR: 8, 9; weight 9 lb 11.4 oz (4405 g).   Placenta status: , .  Cord:  with the following complications: .  Cord pH: not sent  Anesthesia:   Episiotomy: None Lacerations: 1st degree Suture Repair: 2.0 chromic Est. Blood Loss (mL): 200  Mom to postpartum.  Baby to Couplet care / Skin to Skin. Mild shoulder dystocia reduced with Lysle DingwallMc Roberts and suprapubic pressure reduced in 15 seconds Stacy Morales A 01/07/2017, 5:56 PM

## 2016-12-25 ENCOUNTER — Inpatient Hospital Stay (HOSPITAL_COMMUNITY)
Admission: AD | Admit: 2016-12-25 | Payer: Federal, State, Local not specified - PPO | Source: Ambulatory Visit | Admitting: Obstetrics & Gynecology

## 2017-01-06 ENCOUNTER — Telehealth (HOSPITAL_COMMUNITY): Payer: Self-pay | Admitting: *Deleted

## 2017-01-06 ENCOUNTER — Other Ambulatory Visit: Payer: Self-pay | Admitting: Obstetrics and Gynecology

## 2017-01-06 ENCOUNTER — Encounter (HOSPITAL_COMMUNITY): Payer: Self-pay | Admitting: *Deleted

## 2017-01-06 NOTE — Telephone Encounter (Signed)
Preadmission screen  

## 2017-01-06 NOTE — H&P (Signed)
Stacy Morales is a 31 y.o. female, G3P2002 at 3541 weeks, presenting for induction of labor.  Prenatal history remarkable for closed spaced pregnancy.  Fm+.  Denies bleeding or leaking of fluid  Patient Active Problem List   Diagnosis Date Noted  . Thrombosed external hemorrhoid 04/14/2015  . Marginal insertion of umbilical cord 04/14/2015  . Perineal hematoma - after first delivery 04/14/2015  . NSVD (normal spontaneous vaginal delivery) 04/14/2015  . Normal labor 04/14/2015  . Seasonal allergies 04/14/2015  . History of abnormal cervical Pap smear in 2013 - normal colpo 04/14/2015  . 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 LGA (large for gestational age) fetus with first baby (9 lbs 1 oz) 04/14/2015    History of present pregnancy: Patient entered care at 9 weeks.   EDC of 12/31/2016 was established by LMP.   Anatomy scan:  20 weeks, with normal findings and an anterior placenta.   Additional US evaluations:  none.   Significant prenatal events:  None     OB History    Gravida Para Term Preterm AB Living   3 2 2     2    SAB TAB Ectopic Multiple Live Births         0 2     Past Medical History:  Diagnosis Date  . Abnormal Pap smear    colpo 09/15/2012  . History of HPV infection    Past Surgical History:  Procedure Laterality Date  . MANDIBLE SURGERY    . shoulder laparoscopic surgery     right  . WISDOM TOOTH EXTRACTION     Family History: family history includes Diabetes in her maternal grandfather; Heart disease in her paternal grandfather; Stroke in her paternal grandfather. Social History:  reports that she has never smoked. She has never used smokeless tobacco. She reports that she does not drink alcohol or use drugs.   Prenatal Transfer Tool  Maternal Diabetes: No Genetic Screening: Declined Maternal Ultrasounds/Referrals: Declined Fetal Ultrasounds or other Referrals:  None Maternal Substance Abuse:  No Significant  Maternal Medications:  None Significant Maternal Lab Results: None  TDAP Yes Flu Yes  ROS:  All 10 systems reviewed and negative expect as stated above  No Known Allergies     Last menstrual period 03/20/2016, unknown if currently breastfeeding.  Chest clear Heart RRR without murmur Abd gravid, NT, FH appropriate Pelvic: Proven to 9-1 Ext:Neg edema  FHR: Category 1 UCs:  irregular  Prenatal labs: ABO, Rh: O/Positive/-- (06/07 0000) Antibody: Negative (06/07 0000) Rubella:  Immune RPR: Nonreactive (06/07 0000)  HBsAg: Negative (06/07 0000)  HIV: Non-reactive (06/07 0000)  GBS:  Negative  Pap:  2016 negative GC:  Neg Chlamydia:  Neg Genetic screenings:  Declined Glucola:  72 Other:   Hgb 14 at NOB, 13 at 28 weeks       Assessment/Plan: IUP at 41 weeks presents for induction of labor Cat 1 strip  Plan: Admit to Birthing Suite per consult with Sallye OberKulwa Routine [facility] orders Pain med/epidural prn   Henderson NewcomerNancy Jean ProtheroCNM, MSN 01/06/2017, 9:39 PM

## 2017-01-07 ENCOUNTER — Inpatient Hospital Stay (HOSPITAL_COMMUNITY): Payer: Self-pay | Admitting: Anesthesiology

## 2017-01-07 ENCOUNTER — Encounter (HOSPITAL_COMMUNITY): Payer: Self-pay

## 2017-01-07 ENCOUNTER — Inpatient Hospital Stay (HOSPITAL_COMMUNITY)
Admission: RE | Admit: 2017-01-07 | Discharge: 2017-01-08 | DRG: 775 | Disposition: A | Discharge status: Home / Self Care | Payer: Self-pay | Source: Ambulatory Visit | Attending: Obstetrics and Gynecology | Admitting: Obstetrics and Gynecology

## 2017-01-07 DIAGNOSIS — Z823 Family history of stroke: Secondary | ICD-10-CM

## 2017-01-07 DIAGNOSIS — Z8249 Family history of ischemic heart disease and other diseases of the circulatory system: Secondary | ICD-10-CM

## 2017-01-07 DIAGNOSIS — O480 Post-term pregnancy: Secondary | ICD-10-CM | POA: Diagnosis present

## 2017-01-07 DIAGNOSIS — Z3A41 41 weeks gestation of pregnancy: Secondary | ICD-10-CM

## 2017-01-07 DIAGNOSIS — Z833 Family history of diabetes mellitus: Secondary | ICD-10-CM

## 2017-01-07 LAB — CBC
HCT: 35.5 % — ABNORMAL LOW (ref 36.0–46.0)
Hemoglobin: 12.7 g/dL (ref 12.0–15.0)
MCH: 33.1 pg (ref 26.0–34.0)
MCHC: 35.8 g/dL (ref 30.0–36.0)
MCV: 92.4 fL (ref 78.0–100.0)
PLATELETS: 174 10*3/uL (ref 150–400)
RBC: 3.84 MIL/uL — ABNORMAL LOW (ref 3.87–5.11)
RDW: 13.4 % (ref 11.5–15.5)
WBC: 7.3 10*3/uL (ref 4.0–10.5)

## 2017-01-07 LAB — TYPE AND SCREEN
ABO/RH(D): O POS
ANTIBODY SCREEN: NEGATIVE

## 2017-01-07 LAB — RPR: RPR Ser Ql: NONREACTIVE

## 2017-01-07 MED ORDER — ONDANSETRON HCL 4 MG/2ML IJ SOLN: 4.0000 mg | Freq: Four times a day (QID) | INTRAMUSCULAR | Status: DC | PRN

## 2017-01-07 MED ORDER — DIPHENHYDRAMINE HCL 25 MG PO CAPS: 25.0000 mg | ORAL_CAPSULE | Freq: Four times a day (QID) | ORAL | Status: DC | PRN

## 2017-01-07 MED ORDER — ACETAMINOPHEN 325 MG PO TABS: 650.0000 mg | ORAL_TABLET | ORAL | Status: DC | PRN

## 2017-01-07 MED ORDER — BENZOCAINE-MENTHOL 20-0.5 % EX AERO: 1.0000 "application " | INHALATION_SPRAY | CUTANEOUS | Status: DC | PRN

## 2017-01-07 MED ORDER — LIDOCAINE HCL (PF) 1 % IJ SOLN
INTRAMUSCULAR | Status: DC | PRN
  Administered 2017-01-07 (×2): 7 mL via EPIDURAL

## 2017-01-07 MED ORDER — PHENYLEPHRINE 40 MCG/ML (10ML) SYRINGE FOR IV PUSH (FOR BLOOD PRESSURE SUPPORT)
80.0000 ug | PREFILLED_SYRINGE | INTRAVENOUS | Status: DC | PRN
  Filled 2017-01-07: qty 5

## 2017-01-07 MED ORDER — FENTANYL 2.5 MCG/ML BUPIVACAINE 1/10 % EPIDURAL INFUSION (WH - ANES)
INTRAMUSCULAR | Status: AC
  Filled 2017-01-07: qty 100

## 2017-01-07 MED ORDER — EPHEDRINE 5 MG/ML INJ
10.0000 mg | INTRAVENOUS | Status: DC | PRN
  Filled 2017-01-07: qty 4

## 2017-01-07 MED ORDER — COCONUT OIL OIL: 1.0000 "application " | TOPICAL_OIL | Status: DC | PRN

## 2017-01-07 MED ORDER — TERBUTALINE SULFATE 1 MG/ML IJ SOLN
0.2500 mg | Freq: Once | INTRAMUSCULAR | Status: DC | PRN
  Filled 2017-01-07: qty 1

## 2017-01-07 MED ORDER — FENTANYL CITRATE (PF) 100 MCG/2ML IJ SOLN: 50.0000 ug | INTRAMUSCULAR | Status: DC | PRN

## 2017-01-07 MED ORDER — OXYTOCIN 40 UNITS IN LACTATED RINGERS INFUSION - SIMPLE MED
1.0000 m[IU]/min | INTRAVENOUS | Status: DC
  Filled 2017-01-07: qty 1000

## 2017-01-07 MED ORDER — ZOLPIDEM TARTRATE 5 MG PO TABS: 5.0000 mg | ORAL_TABLET | Freq: Every evening | ORAL | Status: DC | PRN

## 2017-01-07 MED ORDER — LACTATED RINGERS IV SOLN
INTRAVENOUS | Status: DC
Start: 2017-01-07 — End: 2017-01-07
  Administered 2017-01-07: 17:00:00 via INTRAVENOUS

## 2017-01-07 MED ORDER — WITCH HAZEL-GLYCERIN EX PADS: 1.0000 "application " | MEDICATED_PAD | CUTANEOUS | Status: DC | PRN

## 2017-01-07 MED ORDER — SIMETHICONE 80 MG PO CHEW: 80.0000 mg | CHEWABLE_TABLET | ORAL | Status: DC | PRN

## 2017-01-07 MED ORDER — SOD CITRATE-CITRIC ACID 500-334 MG/5ML PO SOLN: 30.0000 mL | ORAL | Status: DC | PRN

## 2017-01-07 MED ORDER — FENTANYL 2.5 MCG/ML BUPIVACAINE 1/10 % EPIDURAL INFUSION (WH - ANES)
14.0000 mL/h | INTRAMUSCULAR | Status: DC | PRN
  Administered 2017-01-07: 14 mL/h via EPIDURAL

## 2017-01-07 MED ORDER — TETANUS-DIPHTH-ACELL PERTUSSIS 5-2.5-18.5 LF-MCG/0.5 IM SUSP: 0.5000 mL | Freq: Once | INTRAMUSCULAR | Status: DC

## 2017-01-07 MED ORDER — SENNOSIDES-DOCUSATE SODIUM 8.6-50 MG PO TABS
2.0000 | ORAL_TABLET | ORAL | Status: DC
  Filled 2017-01-07: qty 2

## 2017-01-07 MED ORDER — IBUPROFEN 600 MG PO TABS
600.0000 mg | ORAL_TABLET | Freq: Four times a day (QID) | ORAL | Status: DC
Start: 2017-01-07 — End: 2017-01-08
  Administered 2017-01-08 (×4): 600 mg via ORAL
  Filled 2017-01-07 (×4): qty 1

## 2017-01-07 MED ORDER — ONDANSETRON HCL 4 MG PO TABS: 4.0000 mg | ORAL_TABLET | ORAL | Status: DC | PRN

## 2017-01-07 MED ORDER — FLEET ENEMA 7-19 GM/118ML RE ENEM: 1.0000 | ENEMA | RECTAL | Status: DC | PRN

## 2017-01-07 MED ORDER — PRENATAL MULTIVITAMIN CH
1.0000 | ORAL_TABLET | Freq: Every day | ORAL | Status: DC
  Administered 2017-01-08: 1 via ORAL
  Filled 2017-01-07: qty 1

## 2017-01-07 MED ORDER — LACTATED RINGERS IV SOLN: 500.0000 mL | INTRAVENOUS | Status: DC | PRN

## 2017-01-07 MED ORDER — OXYTOCIN BOLUS FROM INFUSION: 500.0000 mL | Freq: Once | INTRAVENOUS | Status: DC

## 2017-01-07 MED ORDER — OXYTOCIN 40 UNITS IN LACTATED RINGERS INFUSION - SIMPLE MED: 2.5000 [IU]/h | INTRAVENOUS | Status: DC

## 2017-01-07 MED ORDER — LACTATED RINGERS IV SOLN
500.0000 mL | Freq: Once | INTRAVENOUS | Status: AC
  Administered 2017-01-07: 500 mL via INTRAVENOUS

## 2017-01-07 MED ORDER — DIPHENHYDRAMINE HCL 50 MG/ML IJ SOLN: 12.5000 mg | INTRAMUSCULAR | Status: DC | PRN

## 2017-01-07 MED ORDER — LIDOCAINE HCL (PF) 1 % IJ SOLN
30.0000 mL | INTRAMUSCULAR | Status: DC | PRN
  Filled 2017-01-07: qty 30

## 2017-01-07 MED ORDER — PHENYLEPHRINE 40 MCG/ML (10ML) SYRINGE FOR IV PUSH (FOR BLOOD PRESSURE SUPPORT)
PREFILLED_SYRINGE | INTRAVENOUS | Status: AC
  Filled 2017-01-07: qty 10

## 2017-01-07 MED ORDER — MISOPROSTOL 25 MCG QUARTER TABLET
25.0000 ug | ORAL_TABLET | ORAL | Status: DC | PRN
  Administered 2017-01-07: 25 ug via VAGINAL
  Filled 2017-01-07: qty 1
  Filled 2017-01-07: qty 0.25

## 2017-01-07 MED ORDER — ONDANSETRON HCL 4 MG/2ML IJ SOLN: 4.0000 mg | INTRAMUSCULAR | Status: DC | PRN

## 2017-01-07 MED ORDER — DIBUCAINE 1 % RE OINT: 1.0000 "application " | TOPICAL_OINTMENT | RECTAL | Status: DC | PRN

## 2017-01-07 NOTE — Lactation Note (Signed)
This note was copied from a baby's chart. Lactation Consultation Note  Patient Name: Stacy Morales WNUUV'OToday's Date: 01/07/2017 Reason for consult: Initial assessment Baby at 3 hr of life. Experienced bf reports latching is going well. She denies breast or nipple pain, voiced no concerns. Discussed baby behavior, feeding frequency, baby belly size, voids, wt loss, breast changes, and nipple care. She stated she can manually express and has spoon in room. Given lactation handouts. Aware of OP services and support group. She will call as needed.     Maternal Data Has patient been taught Hand Expression?: Yes Does the patient have breastfeeding experience prior to this delivery?: Yes  Feeding Feeding Type: Breast Fed  LATCH Score/Interventions Latch:  (this is the same feeding as 2030)  Audible Swallowing: A few with stimulation Intervention(s): Skin to skin  Type of Nipple: Everted at rest and after stimulation  Comfort (Breast/Nipple): Soft / non-tender     Hold (Positioning): No assistance needed to correctly position infant at breast.  LATCH Score: 9  Lactation Tools Discussed/Used WIC Program: No   Consult Status Consult Status: Follow-up Date: 01/08/17 Follow-up type: In-patient    Rulon Eisenmengerlizabeth E Grizel Vesely 01/07/2017, 8:58 PM

## 2017-01-07 NOTE — Anesthesia Pain Management Evaluation Note (Signed)
  CRNA Pain Management Visit Note  Patient: Stacy Morales, 31 y.o., female  "Hello I am a [facility]. We have an anesthesia team available at all times to provide care throughout the hospital, including epidural management and anesthesia for C-section. I don't know your plan for the delivery whether it a natural birth, water birth, IV sedation, nitrous supplementation, doula or epidural, but we want to meet your pain goals."   1.Was your pain managed to your expectations on prior hospitalizations?   Yes with natural childbirth  2.What is your expectation for pain management during this hospitalization?     Labor support without medications  3.How can we help you reach that goal? Be available;educated on options  Record the patient's initial score and the patient's pain goal.   Pain: 0  Pain Goal: 10 [facility] wants you to be able to say your pain was always managed very well.  Edison PaceWILKERSON,Kallan Merrick 01/07/2017

## 2017-01-07 NOTE — Anesthesia Preprocedure Evaluation (Addendum)
Anesthesia Evaluation  Patient identified by MRN, date of birth, ID band Patient awake    Reviewed: Allergy & Precautions, H&P , NPO status , Patient's Chart, lab work & pertinent test results  Airway Mallampati: I  TM Distance: >3 FB Neck ROM: full    Dental no notable dental hx.    Pulmonary neg pulmonary ROS,    Pulmonary exam normal        Cardiovascular negative cardio ROS Normal cardiovascular exam     Neuro/Psych negative neurological ROS  negative psych ROS   GI/Hepatic negative GI ROS, Neg liver ROS,   Endo/Other  negative endocrine ROS  Renal/GU negative Renal ROS     Musculoskeletal negative musculoskeletal ROS (+)   Abdominal Normal abdominal exam  (+)   Peds  Hematology negative hematology ROS (+)   Anesthesia Other Findings   Reproductive/Obstetrics (+) Pregnancy                             Anesthesia Physical  Anesthesia Plan  ASA: II  Anesthesia Plan: Epidural   Post-op Pain Management:    Induction:   Airway Management Planned:   Additional Equipment:   Intra-op Plan:   Post-operative Plan:   Informed Consent: I have reviewed the patients History and Physical, chart, labs and discussed the procedure including the risks, benefits and alternatives for the proposed anesthesia with the patient or authorized representative who has indicated his/her understanding and acceptance.     Plan Discussed with:   Anesthesia Plan Comments:         Anesthesia Quick Evaluation  

## 2017-01-07 NOTE — Anesthesia Procedure Notes (Signed)
Epidural Patient location during procedure: OB Start time: 01/07/2017 2:30 PM End time: 01/07/2017 2:33 PM  Staffing Anesthesiologist: Leilani AbleHATCHETT, Karalyne Nusser Performed: anesthesiologist   Preanesthetic Checklist Completed: patient identified, surgical consent, pre-op evaluation, timeout performed, IV checked, risks and benefits discussed and monitors and equipment checked  Epidural Patient position: sitting Prep: site prepped and draped and DuraPrep Patient monitoring: continuous pulse ox and blood pressure Approach: midline Location: L3-L4 Injection technique: LOR air  Needle:  Needle type: Tuohy  Needle gauge: 17 G Needle length: 9 cm and 9 Needle insertion depth: 5 cm cm Catheter type: closed end flexible Catheter size: 19 Gauge Catheter at skin depth: 10 cm Test dose: negative and Other  Assessment Sensory level: T9 Events: blood not aspirated, injection not painful, no injection resistance, negative IV test and no paresthesia  Additional Notes Reason for block:procedure for pain

## 2017-01-08 LAB — CBC
HCT: 36.3 % (ref 36.0–46.0)
HEMOGLOBIN: 12.6 g/dL (ref 12.0–15.0)
MCH: 33.1 pg (ref 26.0–34.0)
MCHC: 34.7 g/dL (ref 30.0–36.0)
MCV: 95.3 fL (ref 78.0–100.0)
PLATELETS: 173 10*3/uL (ref 150–400)
RBC: 3.81 MIL/uL — AB (ref 3.87–5.11)
RDW: 13.1 % (ref 11.5–15.5)
WBC: 11 10*3/uL — AB (ref 4.0–10.5)

## 2017-01-08 MED ORDER — IBUPROFEN 600 MG PO TABS: 600.0000 mg | ORAL_TABLET | Freq: Four times a day (QID) | ORAL | 0 refills | Status: DC

## 2017-01-08 NOTE — Anesthesia Postprocedure Evaluation (Signed)
Anesthesia Post Note  Patient: Stacy Morales  Procedure(s) Performed: * No procedures listed *  Patient location during evaluation: Mother Baby Anesthesia Type: Epidural Level of consciousness: awake, awake and alert, oriented and patient cooperative Pain management: pain level controlled Vital Signs Assessment: post-procedure vital signs reviewed and stable Respiratory status: spontaneous breathing, nonlabored ventilation and respiratory function stable Cardiovascular status: stable Postop Assessment: no headache, no backache, patient able to bend at knees and no signs of nausea or vomiting Anesthetic complications: no        Last Vitals:  Vitals:   01/07/17 2143 01/08/17 0219  BP: (!) 101/55 117/77  Pulse: (!) 55 (!) 57  Resp: 18 18  Temp: 36.8 C 36.6 C    Last Pain:  Vitals:   01/08/17 0800  TempSrc:   PainSc: 5    Pain Goal: Patients Stated Pain Goal: 1 (01/08/17 0800)               Rachelle Edwards L

## 2017-01-08 NOTE — Discharge Instructions (Signed)

## 2017-01-08 NOTE — Discharge Summary (Signed)
Obstetric Discharge Summary Reason for Admission: induction of labor Prenatal Procedures: NST Intrapartum Procedures: spontaneous vaginal delivery Postpartum Procedures: none Complications-Operative and Postpartum: none Hemoglobin  Date Value Ref Range Status  01/08/2017 12.6 12.0 - 15.0 g/dL Final   HCT  Date Value Ref Range Status  01/08/2017 36.3 36.0 - 46.0 % Final    Physical Exam:  General: alert and cooperative Lochia: appropriate Uterine Fundus: firm Incision: na DVT Evaluation: No evidence of DVT seen on physical exam.  Discharge Diagnoses: Term Pregnancy-delivered  Discharge Information: Date: 01/08/2017 Activity: pelvic rest Diet: routine Medications: PNV and Ibuprofen Condition: stable Instructions: refer to practice specific booklet Discharge to: home   Newborn Data: Live born female  Birth Weight: 9 lb 11.4 oz (4405 g) APGAR: 8, 9  Home with mother.  Stacy Morales A 01/08/2017, 1:43 PM

## 2017-04-01 ENCOUNTER — Encounter (HOSPITAL_COMMUNITY): Payer: Self-pay | Admitting: Obstetrics and Gynecology

## 2019-02-15 ENCOUNTER — Encounter: Payer: Self-pay | Admitting: Family Medicine

## 2019-02-15 ENCOUNTER — Ambulatory Visit: Payer: Self-pay | Admitting: Family Medicine

## 2019-02-15 VITALS — BP 118/60 | HR 72 | Temp 97.4°F | Ht 67.0 in | Wt 137.2 lb

## 2019-02-15 DIAGNOSIS — J0100 Acute maxillary sinusitis, unspecified: Secondary | ICD-10-CM

## 2019-02-15 MED ORDER — AMOXICILLIN-POT CLAVULANATE 875-125 MG PO TABS: 1.0000 | ORAL_TABLET | Freq: Two times a day (BID) | ORAL | 0 refills | Status: DC

## 2019-02-15 MED ORDER — FLUCONAZOLE 150 MG PO TABS: 150.0000 mg | ORAL_TABLET | Freq: Once | ORAL | 0 refills | Status: AC

## 2019-02-15 MED ORDER — FLUTICASONE PROPIONATE 50 MCG/ACT NA SUSP: 2.0000 | Freq: Every day | NASAL | 6 refills | Status: DC

## 2019-02-15 MED ORDER — PREDNISONE 20 MG PO TABS: 40.0000 mg | ORAL_TABLET | Freq: Every day | ORAL | 0 refills | Status: DC

## 2019-02-15 NOTE — Progress Notes (Addendum)
Patient: Stacy Morales MRN: [id number] DOB: 1986-03-26 PCP: Patient, No Pcp Per     Subjective:  Chief Complaint  Patient presents with  . poss sinus infection  . Cough    HPI: The patient is a 33 y.o. female who presents today for cough, sinus pain and pressure. She has been sick for 2 weeks. Her and her kids all had a fever/cough/congestion and she thought they had the flu. She has never gotten better and continues to have sinus pain/pressure. She has had fever/chills over a week ago, but none this week. She has had a productive cough that is green in nature. She has no shortness of breath/wheezing. No hx of smoking/ashtma. She has had a bad headache/congestion and some right hear pain. She has taken some over the counter cough medication and advil. Denies any pain in teeth or foul smell.   Review of Systems  Constitutional: Positive for chills, fatigue and fever.  HENT: Positive for congestion, ear pain, postnasal drip, rhinorrhea, sinus pressure and sinus pain. Negative for sore throat and trouble swallowing.        C/o right ear pain  Eyes: Negative for photophobia, pain and visual disturbance.  Respiratory: Positive for cough. Negative for shortness of breath.   Cardiovascular: Negative for chest pain.  Gastrointestinal: Positive for nausea. Negative for abdominal pain and vomiting.  Musculoskeletal: Positive for back pain, neck pain and neck stiffness.  Neurological: Positive for headaches. Negative for dizziness.  Psychiatric/Behavioral: Positive for sleep disturbance.    Allergies Patient has No Known Allergies.  Past Medical History Patient  has a past medical history of Abnormal Pap smear and History of HPV infection.  Surgical History Patient  has a past surgical history that includes shoulder laparoscopic surgery; Mandible surgery; and Wisdom tooth extraction.  Family History Pateint's family history includes Diabetes in her maternal grandfather; Heart disease  in her paternal grandfather; Stroke in her paternal grandfather.  Social History Patient  reports that she has never smoked. She has never used smokeless tobacco. She reports that she does not drink alcohol or use drugs.    Objective: Vitals:   02/15/19 1020  BP: 118/60  Pulse: 72  Temp: (!) 97.4 F (36.3 C)  TempSrc: Oral  SpO2: 97%  Weight: 137 lb 3.2 oz (62.2 kg)  Height: 5\' 7"  (1.702 m)    Body mass index is 21.49 kg/m.  Physical Exam Vitals signs reviewed.  Constitutional:      Appearance: Normal appearance.  HENT:     Right Ear: Tympanic membrane, ear canal and external ear normal.     Left Ear: Tympanic membrane, ear canal and external ear normal.     Nose: Congestion present.     Comments: +TTP over maxillary sinuses +turbinate edema     Mouth/Throat:     Comments: +cobblestoning on posterior pharynx  Neck:     Musculoskeletal: Normal range of motion and neck supple.  Cardiovascular:     Rate and Rhythm: Normal rate and regular rhythm.     Heart sounds: Normal heart sounds.  Pulmonary:     Effort: Pulmonary effort is normal. No respiratory distress.     Breath sounds: Normal breath sounds. No wheezing or rales.  Abdominal:     General: Abdomen is flat. Bowel sounds are normal.     Palpations: Abdomen is soft.  Lymphadenopathy:     Cervical: Cervical adenopathy present.  Skin:    General: Skin is warm and dry.     Capillary  Refill: Capillary refill takes less than 2 seconds.  Neurological:     General: No focal deficit present.     Mental Status: She is alert and oriented to person, place, and time.        Assessment/plan: 1. Acute non-recurrent maxillary sinusitis Steroid burst and course of augmentin. Start flonsae. Also recommended cool mist humidifier and honey, but think cough from drainage. Let us know if not getting better. Recommended f/u for annual/labs.    Return if symptoms worsen or fail to improve.   Orland Mustard, MD [facility]   02/15/2019   Resent medication to a different pharmacy that was cheaper.  Orland Mustard, MD [facility]

## 2019-02-15 NOTE — Patient Instructions (Signed)
Steroid bust x 5 day and flonase daily to twice a day. If not better start augmentin, but you'll likely need.   Sinusitis, Adult Sinusitis is soreness and swelling (inflammation) of your sinuses. Sinuses are hollow spaces in the bones around your face. They are located:  Around your eyes.  In the middle of your forehead.  Behind your nose.  In your cheekbones. Your sinuses and nasal passages are lined with a fluid called mucus. Mucus drains out of your sinuses. Swelling can trap mucus in your sinuses. This lets germs (bacteria, virus, or fungus) grow, which leads to infection. Most of the time, this condition is caused by a virus. What are the causes? This condition is caused by:  Allergies.  Asthma.  Germs.  Things that block your nose or sinuses.  Growths in the nose (nasal polyps).  Chemicals or irritants in the air.  Fungus (rare). What increases the risk? You are more likely to develop this condition if:  You have a weak body defense system (immune system).  You do a lot of swimming or diving.  You use nasal sprays too much.  You smoke. What are the signs or symptoms? The main symptoms of this condition are pain and a feeling of pressure around the sinuses. Other symptoms include:  Stuffy nose (congestion).  Runny nose (drainage).  Swelling and warmth in the sinuses.  Headache.  Toothache.  A cough that may get worse at night.  Mucus that collects in the throat or the back of the nose (postnasal drip).  Being unable to smell and taste.  Being very tired (fatigue).  A fever.  Sore throat.  Bad breath. How is this diagnosed? This condition is diagnosed based on:  Your symptoms.  Your medical history.  A physical exam.  Tests to find out if your condition is short-term (acute) or long-term (chronic). Your doctor may: ? Check your nose for growths (polyps). ? Check your sinuses using a tool that has a light (endoscope). ? Check for  allergies or germs. ? Do imaging tests, such as an MRI or CT scan. How is this treated? Treatment for this condition depends on the cause and whether it is short-term or long-term.  If caused by a virus, your symptoms should go away on their own within 10 days. You may be given medicines to relieve symptoms. They include: ? Medicines that shrink swollen tissue in the nose. ? Medicines that treat allergies (antihistamines). ? A spray that treats swelling of the nostrils. ? Rinses that help get rid of thick mucus in your nose (nasal saline washes).  If caused by bacteria, your doctor may wait to see if you will get better without treatment. You may be given antibiotic medicine if you have: ? A very bad infection. ? A weak body defense system.  If caused by growths in the nose, you may need to have surgery. Follow these instructions at home: Medicines  Take, use, or apply over-the-counter and prescription medicines only as told by your doctor. These may include nasal sprays.  If you were prescribed an antibiotic medicine, take it as told by your doctor. Do not stop taking the antibiotic even if you start to feel better. Hydrate and humidify   Drink enough water to keep your pee (urine) pale yellow.  Use a cool mist humidifier to keep the humidity level in your home above 50%.  Breathe in steam for 10-15 minutes, 3-4 times a day, or as told by your doctor. You  can do this in the bathroom while a hot shower is running.  Try not to spend time in cool or dry air. Rest  Rest as much as you can.  Sleep with your head raised (elevated).  Make sure you get enough sleep each night. General instructions   Put a warm, moist washcloth on your face 3-4 times a day, or as often as told by your doctor. This will help with discomfort.  Wash your hands often with soap and water. If there is no soap and water, use hand sanitizer.  Do not smoke. Avoid being around people who are smoking  (secondhand smoke).  Keep all follow-up visits as told by your doctor. This is important. Contact a doctor if:  You have a fever.  Your symptoms get worse.  Your symptoms do not get better within 10 days. Get help right away if:  You have a very bad headache.  You cannot stop throwing up (vomiting).  You have very bad pain or swelling around your face or eyes.  You have trouble seeing.  You feel confused.  Your neck is stiff.  You have trouble breathing. Summary  Sinusitis is swelling of your sinuses. Sinuses are hollow spaces in the bones around your face.  This condition is caused by tissues in your nose that become inflamed or swollen. This traps germs. These can lead to infection.  If you were prescribed an antibiotic medicine, take it as told by your doctor. Do not stop taking it even if you start to feel better.  Keep all follow-up visits as told by your doctor. This is important. This information is not intended to replace advice given to you by your health care provider. Make sure you discuss any questions you have with your health care provider. Document Released: 05/20/2008 Document Revised: 05/04/2018 Document Reviewed: 05/04/2018 Elsevier Interactive Patient Education  2019 [employer].

## 2019-02-15 NOTE — Addendum Note (Signed)
Addended by: Orland Mustard on: 02/15/2019 11:35 AM   Modules accepted: Orders

## 2019-02-17 MED ORDER — AMOXICILLIN-POT CLAVULANATE 875-125 MG PO TABS: 1.0000 | ORAL_TABLET | Freq: Two times a day (BID) | ORAL | 0 refills | Status: DC

## 2019-02-17 MED ORDER — GUAIFENESIN-CODEINE 100-10 MG/5ML PO SOLN: 10.0000 mL | Freq: Every evening | ORAL | 0 refills | Status: DC | PRN

## 2019-02-17 MED ORDER — BENZONATATE 200 MG PO CAPS: 200.0000 mg | ORAL_CAPSULE | Freq: Three times a day (TID) | ORAL | 0 refills | Status: DC | PRN

## 2019-02-17 MED ORDER — FLUCONAZOLE 150 MG PO TABS: 150.0000 mg | ORAL_TABLET | Freq: Once | ORAL | 0 refills | Status: AC

## 2019-02-17 NOTE — Addendum Note (Signed)
Addended by: Orland Mustard on: 02/17/2019 09:21 AM   Modules accepted: Orders

## 2019-06-30 ENCOUNTER — Other Ambulatory Visit: Payer: Self-pay

## 2019-06-30 ENCOUNTER — Encounter: Payer: Self-pay | Admitting: Physician Assistant

## 2019-06-30 ENCOUNTER — Ambulatory Visit: Payer: Self-pay | Admitting: Physician Assistant

## 2019-06-30 ENCOUNTER — Ambulatory Visit (INDEPENDENT_AMBULATORY_CARE_PROVIDER_SITE_OTHER): Payer: Self-pay

## 2019-06-30 VITALS — BP 110/68 | HR 60 | Temp 98.6°F | Ht 67.0 in | Wt 135.5 lb

## 2019-06-30 DIAGNOSIS — S99911A Unspecified injury of right ankle, initial encounter: Secondary | ICD-10-CM

## 2019-06-30 NOTE — Progress Notes (Signed)
Stacy Morales is a 33 y.o. female here to Establish Care.  I acted as a Neurosurgeonscribe for Energy East CorporationSamantha Merlyn Conley, PA-C Stacy Mullonna Orphanos, LPN  History of Present Illness:   Chief Complaint  Patient presents with  . Establish Care  . injury right foot    10 days ago    Acute Concerns: Right foot pain Pt inured her right foot 10 days ago running. She was running on a trial and inverted her right foot -- may have stepped in a hole. Pain is on the outside of right foot around ankle area and she feels a knot. The area was ecchymotic but if fading. Took Ibuprofen with relief.  Iced as well. Has not immbolized it but has also not been running.  Weight -- Weight: 135 lb 8 oz (61.5 kg)    No flowsheet data found.  No flowsheet data found.   Other providers/specialists: Patient Care Team: Stacy Morales, Stacy Morales, [place] as PCP - General (Physician Assistant)   Past Medical History:  Diagnosis Date  . Abnormal Pap smear    colpo 09/15/2012  . History of HPV infection      Social History   Socioeconomic History  . Marital status: Married    Spouse name: Not on file  . Number of children: Not on file  . Years of education: Not on file  . Highest education level: Not on file  Occupational History  . Not on file  Social Needs  . Financial resource strain: Not on file  . Food insecurity    Worry: Not on file    Inability: Not on file  . Transportation needs    Medical: Not on file    Non-medical: Not on file  Tobacco Use  . Smoking status: Never Smoker  . Smokeless tobacco: Never Used  Substance and Sexual Activity  . Alcohol use: No  . Drug use: No  . Sexual activity: Yes    Birth control/protection: None  Lifestyle  . Physical activity    Days per week: Not on file    Minutes per session: Not on file  . Stress: Not on file  Relationships  . Social Musicianconnections    Talks on phone: Not on file    Gets together: Not on file    Attends religious service: Not on file    Active member of  club or organization: Not on file    Attends meetings of clubs or organizations: Not on file    Relationship status: Not on file  . Intimate partner violence    Fear of current or ex partner: Not on file    Emotionally abused: Not on file    Physically abused: Not on file    Forced sexual activity: Not on file  Other Topics Concern  . Not on file  Social History Narrative  . Not on file    Past Surgical History:  Procedure Laterality Date  . MANDIBLE SURGERY    . shoulder laparoscopic surgery     right  . WISDOM TOOTH EXTRACTION      Family History  Problem Relation Age of Onset  . Diabetes Maternal Grandfather   . Heart disease Paternal Grandfather   . Stroke Paternal Grandfather   . Alcohol abuse Neg Hx   . Arthritis Neg Hx   . Asthma Neg Hx   . Birth defects Neg Hx   . Cancer Neg Hx   . COPD Neg Hx   . Depression Neg Hx   . Drug  abuse Neg Hx   . Early death Neg Hx   . Hearing loss Neg Hx   . Hyperlipidemia Neg Hx   . Hypertension Neg Hx   . Kidney disease Neg Hx   . Learning disabilities Neg Hx   . Mental illness Neg Hx   . Mental retardation Neg Hx   . Miscarriages / Stillbirths Neg Hx   . Vision loss Neg Hx     No Known Allergies   Current Medications:   Current Outpatient Medications:  .  ibuprofen (ADVIL,MOTRIN) 600 MG tablet, Take 1 tablet (600 mg total) by mouth every 6 (six) hours., Disp: 30 tablet, Rfl: 0   Review of Systems:   ROS  Negative unless otherwise specified per HPI.  Vitals:   Vitals:   06/30/19 0810  BP: 110/68  Pulse: 60  Temp: 98.6 F (37 C)  TempSrc: Oral  SpO2: 98%  Weight: 135 lb 8 oz (61.5 kg)  Height: 5\' 7"  (1.702 m)      Body mass index is 21.22 kg/m.  Physical Exam:   Physical Exam Constitutional:      Appearance: She is well-developed.  HENT:     Head: Normocephalic and atraumatic.  Eyes:     Conjunctiva/sclera: Conjunctivae normal.  Neck:     Musculoskeletal: Normal range of motion and neck  supple.  Cardiovascular:     Pulses:          Dorsalis pedis pulses are 2+ on the right side and 2+ on the left side.       Posterior tibial pulses are 2+ on the right side and 2+ on the left side.  Pulmonary:     Effort: Pulmonary effort is normal.  Musculoskeletal: Normal range of motion.     Comments: Foot & Ankle: Overall foot and ankle are well aligned, no significant deformity Significant TTP over the base of the 5th metatarsal/cuboid region. No TTP to navicular, or posterior aspect of medial or lateral malleolus No significant pain or discomfort with isolated passive forefoot abduction. Inversion strength decreased slightly, and resisted inversion reproduces pain to lateral midfoot. Stable to ankle drawer.  Skin:    General: Skin is warm and dry.  Neurological:     Mental Status: She is alert and oriented to person, place, and time.     Comments: Normal sensation to bilateral feet  Psychiatric:        Behavior: Behavior normal.        Thought Content: Thought content normal.        Judgment: Judgment normal.      Assessment and Plan:   Rand was seen today for establish care and injury right foot.  Diagnoses and all orders for this visit:  Injury of right ankle, initial encounter -     DG Ankle Complete Right; Future   Xray negative. Suspect sprain. Continue to avoid aggressive activity. If no improvement in another week, will send to sports medicine.  . Reviewed expectations re: course of current medical issues. . Discussed self-management of symptoms. . Outlined signs and symptoms indicating need for more acute intervention. . Patient verbalized understanding and all questions were answered. . See orders for this visit as documented in the electronic medical record. . Patient received an After-Visit Summary.  CMA or LPN served as scribe during this visit. History, Physical, and Plan performed by medical provider. The above documentation has been reviewed and  is accurate and complete.  Inda Coke, PA-C

## 2020-01-31 ENCOUNTER — Other Ambulatory Visit: Payer: Self-pay | Admitting: Family Medicine

## 2020-01-31 MED ORDER — HYDROCORTISONE (PERIANAL) 2.5 % EX CREA: 1.0000 "application " | TOPICAL_CREAM | Freq: Two times a day (BID) | CUTANEOUS | 0 refills | Status: DC

## 2020-01-31 NOTE — Progress Notes (Signed)
Sending in anusol secondary to hemorrhoids from oxycodone. Recent shoulder surgery. Known hx of hemorrhoids.  Orland Mustard, MD  [facility]

## 2020-02-16 ENCOUNTER — Telehealth: Payer: Self-pay

## 2020-02-16 NOTE — Telephone Encounter (Signed)
She does not want flu shot.  Orland Mustard, MD [facility]

## 2020-02-16 NOTE — Telephone Encounter (Signed)
LVM regarding flu shot 

## 2020-11-01 IMAGING — DX RIGHT ANKLE - COMPLETE 3+ VIEW
3 series · 3 of 3 positions shown · non-contrast
Comparison: None.

CLINICAL DATA: Injury with right ankle pain.

EXAM:
RIGHT ANKLE - COMPLETE 3+ VIEW

[ankle ap]
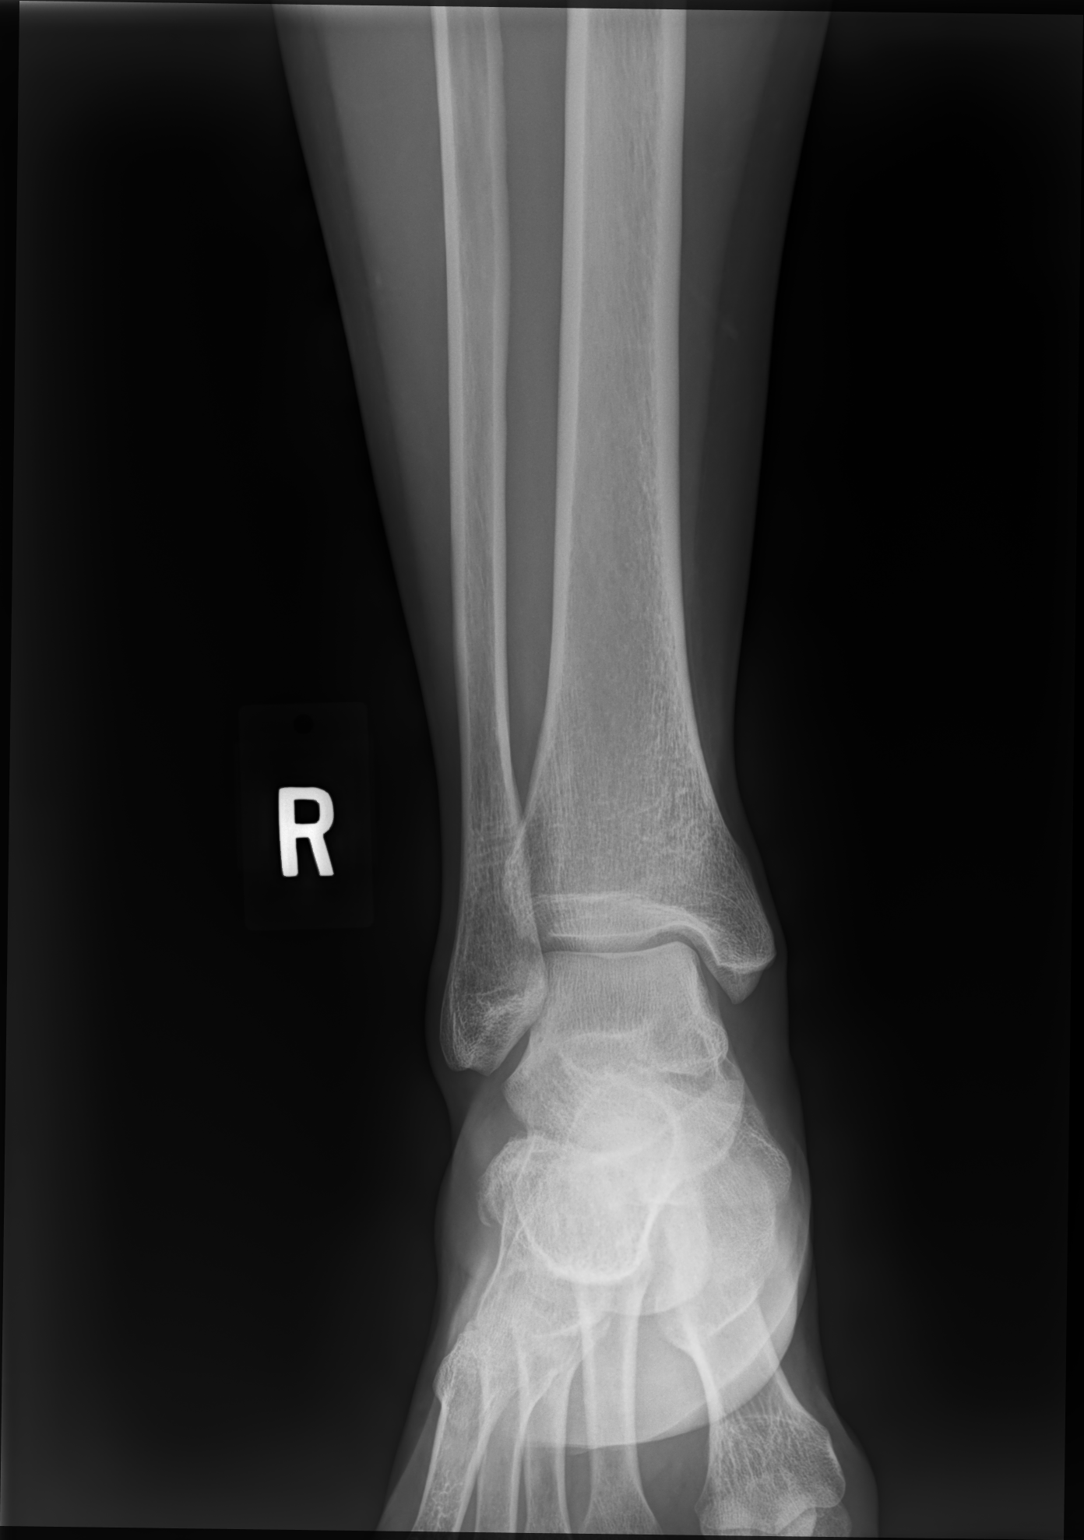

[ankle oblique]
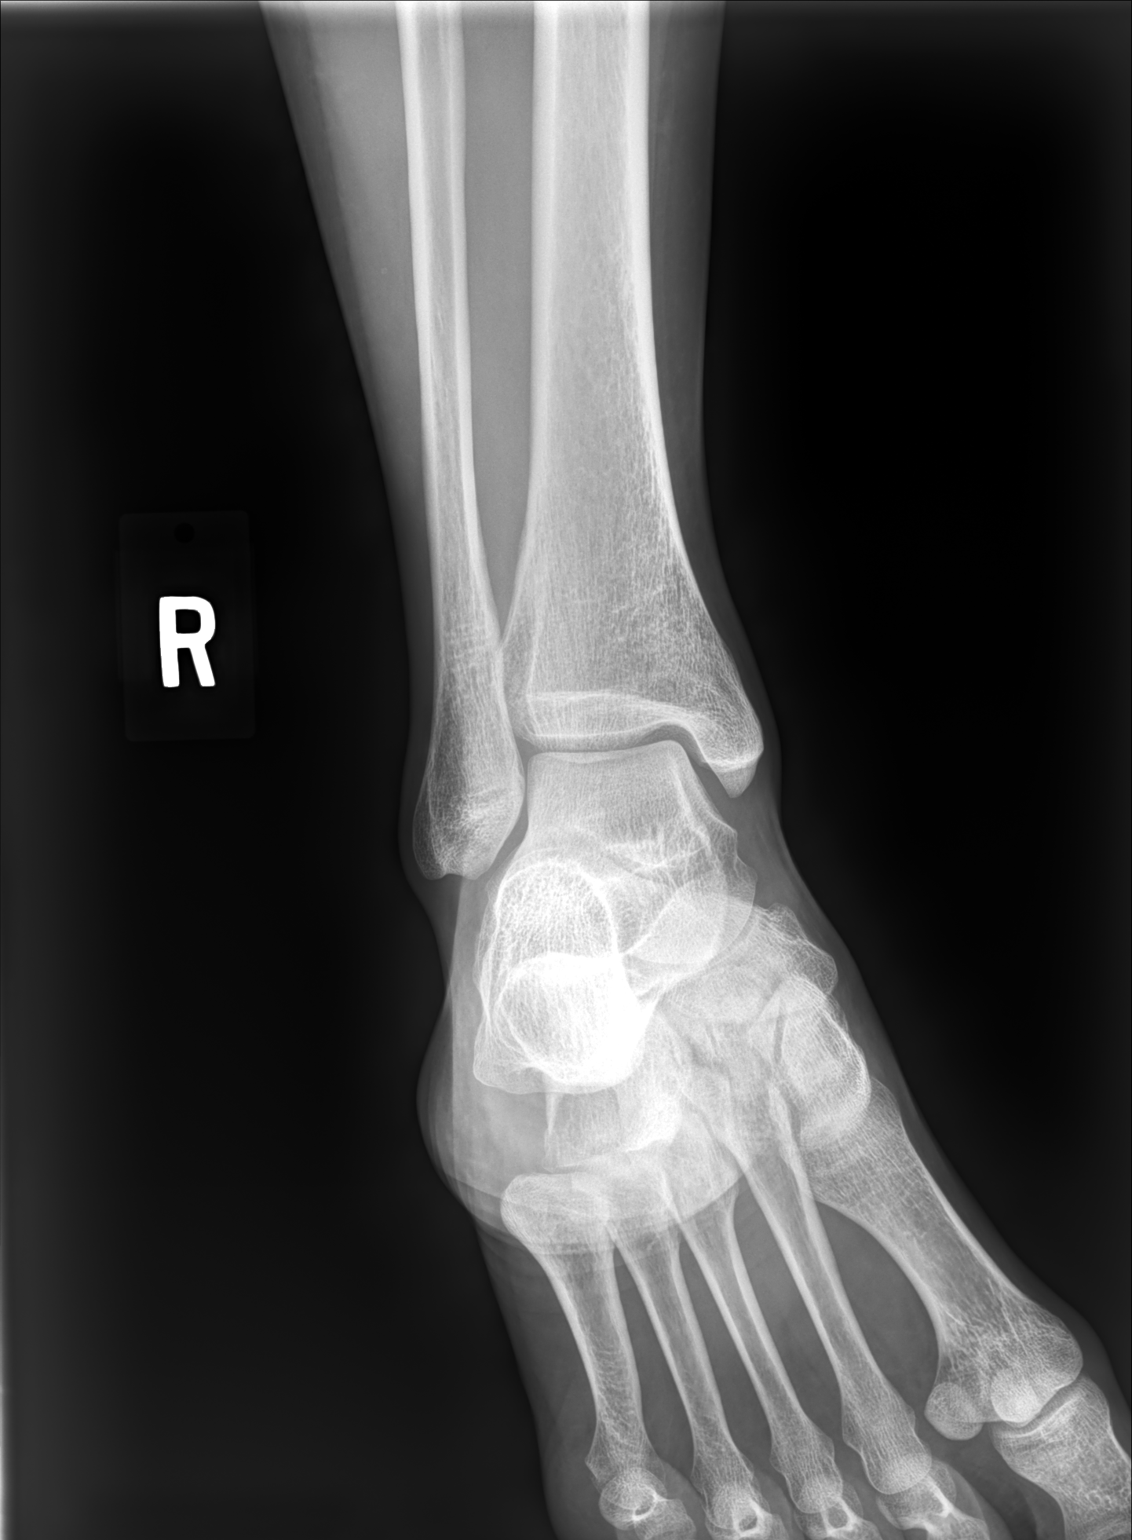

[ankle lat]
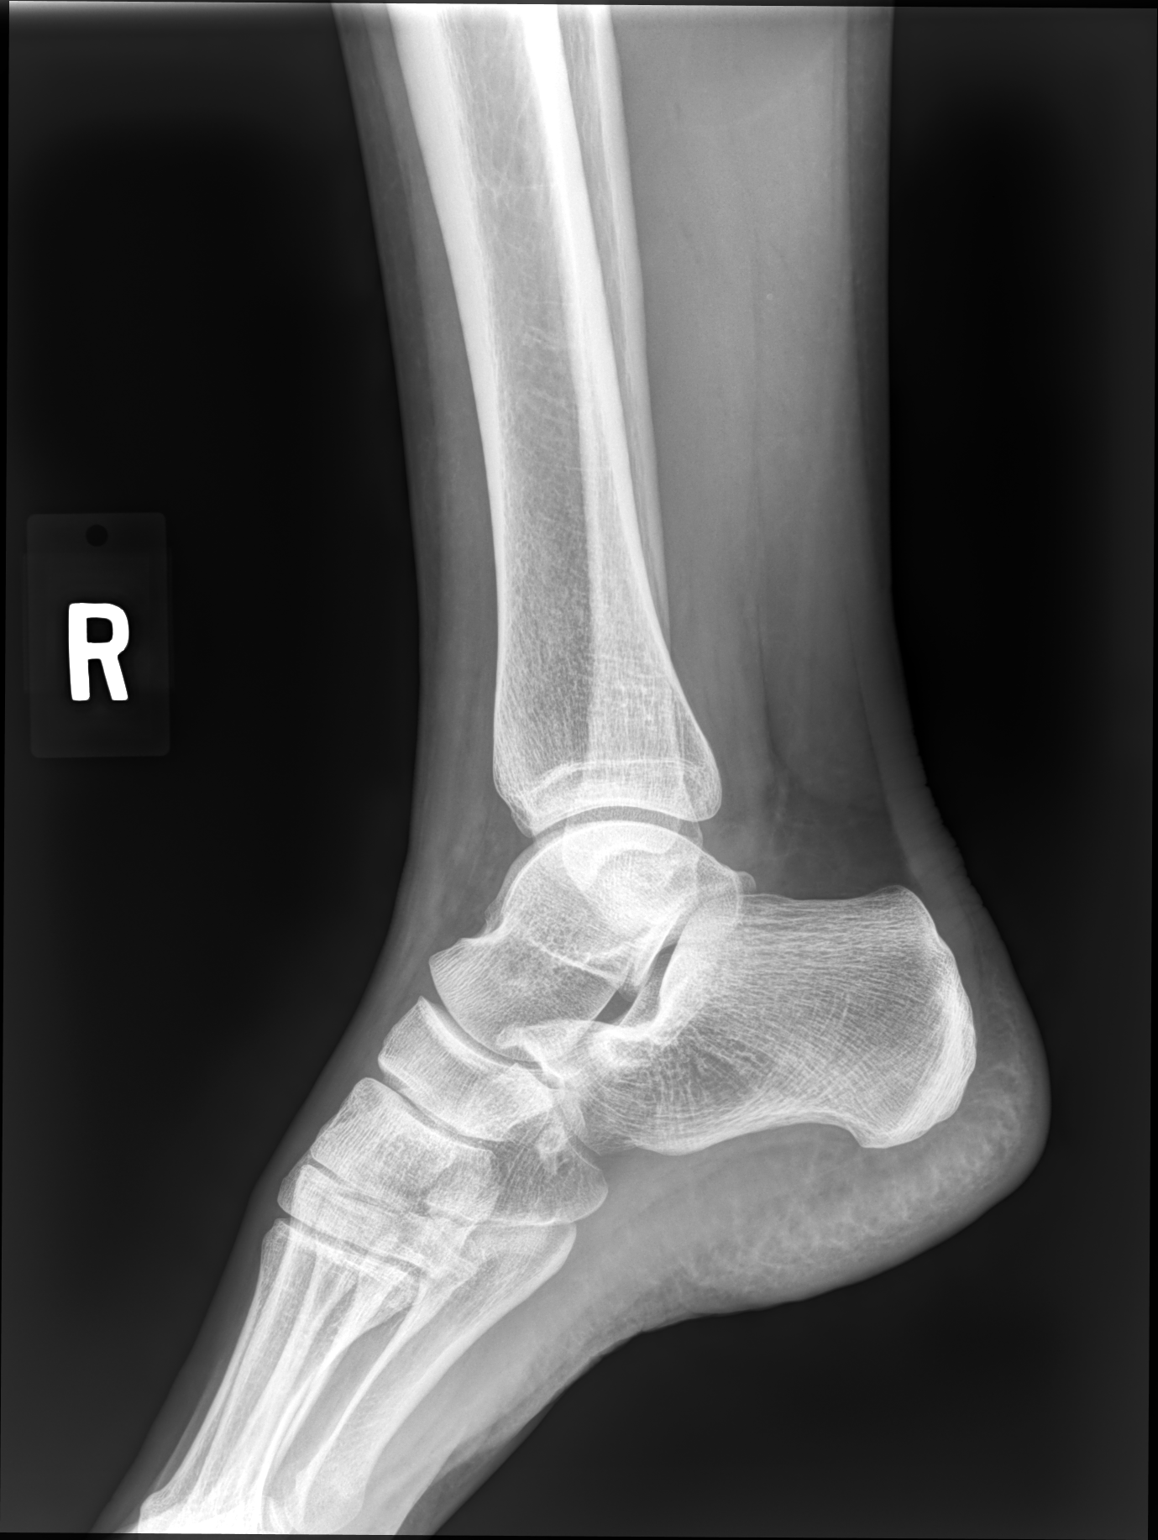

[3 of 3 positions shown; findings below may reference images not displayed]

FINDINGS: There is no evidence of fracture, dislocation, or joint effusion.
There is no evidence of arthropathy or other focal bone abnormality.
Soft tissues are unremarkable.
IMPRESSION: Negative.

## 2020-11-29 ENCOUNTER — Other Ambulatory Visit: Payer: Self-pay | Admitting: Family Medicine

## 2022-01-08 ENCOUNTER — Telehealth: Payer: Self-pay | Admitting: *Deleted

## 2022-01-08 ENCOUNTER — Ambulatory Visit (INDEPENDENT_AMBULATORY_CARE_PROVIDER_SITE_OTHER): Payer: Self-pay | Admitting: Podiatry

## 2022-01-08 ENCOUNTER — Other Ambulatory Visit: Payer: Self-pay

## 2022-01-08 DIAGNOSIS — S90212A Contusion of left great toe with damage to nail, initial encounter: Secondary | ICD-10-CM

## 2022-01-08 MED ORDER — KETOROLAC TROMETHAMINE 10 MG PO TABS: 10.0000 mg | ORAL_TABLET | Freq: Four times a day (QID) | ORAL | 0 refills | Status: DC | PRN

## 2022-01-08 NOTE — Patient Instructions (Signed)

## 2022-01-08 NOTE — Telephone Encounter (Signed)
I'm going to send her toradol can take every 6 hours as needed but no more than for 5 days. She can take 1,000mg  (2 extra strength or 3 regular) tylenol in addition to this every 6 hours. Which pharmacy is best?

## 2022-01-08 NOTE — Telephone Encounter (Signed)
Patient is calling for something stronger than Advil,(not helping much) numbness is wearing off, toe is extremely painful.Please advise.

## 2022-01-08 NOTE — Telephone Encounter (Signed)
Called patient and gave information and her pharmacy is [facility] [address].

## 2022-01-10 ENCOUNTER — Ambulatory Visit: Payer: Self-pay | Admitting: Podiatry

## 2022-01-12 NOTE — Progress Notes (Signed)
°  Subjective:  Patient ID: Stacy Morales, female    DOB: [birth year],  MRN: [id number]  Left great toe nail problem  36 y.o. female presents with the above complaint. History confirmed with patient. October 30th she stubbed her toe during a marathon was swollen and tender she's been trying to keep it taped down  Objective:  Physical Exam: warm, good capillary refill, no trophic changes or ulcerative lesions, normal DP and PT pulses, and normal sensory exam. Left Foot: loose hallux nail distally, proximal plate attached and tender, red swollen Assessment:   1. Contusion of left great toe with damage to nail, initial encounter      Plan:  Patient was evaluated and treated and all questions answered.  Discussed tx options for nail lysis from bed. Advised for nail avulsion to allow new nail to grow in unimpeded. This was done after 1.5cc each lidocaine and marcaine digital block and betadine prep, an elevator was used to avulse the nail plate and it was dressed with a sterile bandage and silvadene ointment. Post care instructions given. Advised on new nail growth. Return PRN for this or other issues  Return if symptoms worsen or fail to improve.

## 2022-02-07 ENCOUNTER — Telehealth: Payer: Self-pay | Admitting: *Deleted

## 2022-02-07 NOTE — Telephone Encounter (Signed)
Patient is calling because her post procedural toe past few days has a massive blister on the nail bed covering 75 %, is oozing a yellowish discharge, painful, no redness noticed.Please advise.

## 2022-02-13 NOTE — Telephone Encounter (Signed)
Tried calling patient but answering service is temporary unavailable.

## 2022-02-15 NOTE — Telephone Encounter (Signed)
Called patient ,no answer,left voice message of Dr Vara Guardian recommendations. ?

## 2023-03-21 NOTE — Pre-Procedure Instructions (Signed)
Surgical Instructions    Your procedure is scheduled on Monday, March 31, 2023   [facility] at 8 A.M., then check in with the Admitting office.  Call this number if you have problems the morning of surgery:  [phone]  If you have any questions prior to your surgery date call [phone]: Open Monday-Friday 8am-4pm If you experience any cold or flu symptoms such as cough, fever, chills, shortness of breath, etc. between now and your scheduled surgery, please notify us at the above number.     Remember:  Do not eat after midnight the night before your surgery  You may drink clear liquids until 7 A.M. the morning of your surgery.   Clear liquids allowed are: Water, Non-Citrus Juices (without pulp), Carbonated Beverages, Clear Tea, Black Coffee Only (NO MILK, CREAM OR POWDERED CREAMER of any kind), and Gatorade.    Take these medicines the morning of surgery with A SIP OF WATER -NONE  As of today, STOP taking any Aspirin (unless otherwise instructed by your surgeon) Aleve, Naproxen, Ibuprofen, Motrin, Advil, Goody's, BC's, all herbal medications, fish oil, and all vitamins.                     Do NOT Smoke (Tobacco/Vaping) for 24 hours prior to your procedure.  If you use a CPAP at night, you may bring your mask/headgear for your overnight stay.   Contacts, glasses, piercing's, hearing aid's, dentures or partials may not be worn into surgery, please bring cases for these belongings.    For patients admitted to the hospital, discharge time will be determined by your treatment team.   Patients discharged the day of surgery will not be allowed to drive home, and someone needs to stay with them for 24 hours.  SURGICAL WAITING ROOM VISITATION Patients having surgery or a procedure may have no more than 2 support people in the waiting area - these visitors may rotate.   Children under the age of 37 must have an adult with them who is not the patient. If the  patient needs to stay at the hospital during part of their recovery, the visitor guidelines for inpatient rooms apply. Pre-op nurse will coordinate an appropriate time for 1 support person to accompany patient in pre-op.  This support person may not rotate.   Please refer to the [facility] website for the visitor guidelines for Inpatients (after your surgery is over and you are in a regular room).    Special instructions:   [place]- Preparing For Surgery  Before surgery, you can play an important role. Because skin is not sterile, your skin needs to be as free of germs as possible. You can reduce the number of germs on your skin by washing with CHG (chlorahexidine gluconate) Soap before surgery.  CHG is an antiseptic cleaner which kills germs and bonds with the skin to continue killing germs even after washing.    Oral Hygiene is also important to reduce your risk of infection.  Remember - BRUSH YOUR TEETH THE MORNING OF SURGERY WITH YOUR REGULAR TOOTHPASTE  Please do not use if you have an allergy to CHG or antibacterial soaps. If your skin becomes reddened/irritated stop using the CHG.  Do not shave (including legs and underarms) for at least 48 hours prior to first CHG shower. It is OK to shave your face.  Please follow these instructions carefully.   Shower the NIGHT BEFORE SURGERY and the MORNING OF SURGERY  If you chose to wash your hair, wash your hair first as usual with your normal shampoo.  After you shampoo, rinse your hair and body thoroughly to remove the shampoo.  Use CHG Soap as you would any other liquid soap. You can apply CHG directly to the skin and wash gently with a scrungie or a clean washcloth.   Apply the CHG Soap to your body ONLY FROM THE NECK DOWN.  Do not use on open wounds or open sores. Avoid contact with your eyes, ears, mouth and genitals (private parts). Wash Face and genitals (private parts)  with your normal soap.   Wash thoroughly, paying special  attention to the area where your surgery will be performed.  Thoroughly rinse your body with warm water from the neck down.  DO NOT shower/wash with your normal soap after using and rinsing off the CHG Soap.  Pat yourself dry with a CLEAN TOWEL.  Wear CLEAN PAJAMAS to bed the night before surgery  Place CLEAN SHEETS on your bed the night before your surgery  DO NOT SLEEP WITH PETS.   Day of Surgery: Take a shower with CHG soap. Do not wear jewelry or makeup Do not wear lotions, powders, perfumes/colognes, or deodorant. Do not shave 48 hours prior to surgery.  Men may shave face and neck. Do not bring valuables to the hospital.  [facility] is not responsible for any belongings or valuables. Do not wear nail polish, gel polish, artificial nails, or any other type of covering on natural nails (fingers and toes) If you have artificial nails or gel coating that need to be removed by a nail salon, please have this removed prior to surgery. Artificial nails or gel coating may interfere with anesthesia's ability to adequately monitor your vital signs. Wear Clean/Comfortable clothing the morning of surgery Remember to brush your teeth WITH YOUR REGULAR TOOTHPASTE.   Please read over the following fact sheets that you were given.    If you received a COVID test during your pre-op visit  it is requested that you wear a mask when out in public, stay away from anyone that may not be feeling well and notify your surgeon if you develop symptoms. If you have been in contact with anyone that has tested positive in the last 10 days please notify you surgeon.

## 2023-03-24 ENCOUNTER — Ambulatory Visit: Payer: Self-pay | Admitting: General Surgery

## 2023-03-24 ENCOUNTER — Encounter (HOSPITAL_COMMUNITY)
Admission: RE | Admit: 2023-03-24 | Discharge: 2023-03-24 | Disposition: A | Discharge status: Home / Self Care | Payer: Self-pay | Source: Ambulatory Visit | Attending: General Surgery | Admitting: General Surgery

## 2023-03-24 ENCOUNTER — Encounter (HOSPITAL_COMMUNITY): Payer: Self-pay

## 2023-03-24 ENCOUNTER — Other Ambulatory Visit: Payer: Self-pay

## 2023-03-24 VITALS — BP 101/69 | HR 62 | Temp 98.0°F | Resp 18 | Ht 67.0 in | Wt 143.2 lb

## 2023-03-24 DIAGNOSIS — R079 Chest pain, unspecified: Secondary | ICD-10-CM | POA: Insufficient documentation

## 2023-03-24 DIAGNOSIS — Z01818 Encounter for other preprocedural examination: Secondary | ICD-10-CM

## 2023-03-24 DIAGNOSIS — K429 Umbilical hernia without obstruction or gangrene: Secondary | ICD-10-CM | POA: Insufficient documentation

## 2023-03-24 DIAGNOSIS — Z01812 Encounter for preprocedural laboratory examination: Secondary | ICD-10-CM | POA: Insufficient documentation

## 2023-03-24 LAB — CBC
HCT: 39.9 % (ref 36.0–46.0)
Hemoglobin: 13.9 g/dL (ref 12.0–15.0)
MCH: 32.1 pg (ref 26.0–34.0)
MCHC: 34.8 g/dL (ref 30.0–36.0)
MCV: 92.1 fL (ref 80.0–100.0)
Platelets: 213 10*3/uL (ref 150–400)
RBC: 4.33 MIL/uL (ref 3.87–5.11)
RDW: 12.5 % (ref 11.5–15.5)
WBC: 4.2 10*3/uL (ref 4.0–10.5)
nRBC: 0 % (ref 0.0–0.2)

## 2023-03-24 NOTE — Progress Notes (Addendum)
PCP - Loleta Dicker Cardiologist - patient denies. Patient has appointment with Dr.Bridgette Cristal Deer May 1,2024. Since August 2023 patient been having chest discomfort and racing heart when she lays in bed at night. Patient believes it's because of anxiety due to husband upcoming spinal surgery. Patient denies any chest pain during daily exercise .  PPM/ICD - pt denies Device Orders - n/a Rep Notified - n/a  Chest x-ray - n/a EKG - 2024-request tracing Stress Test - pt denies ECHO - pt denies Cardiac Cath - pt denies  Sleep Study - pt denies CPAP - n/a  Fasting Blood Sugar - pt denies Checks Blood Sugar _____ times a day  Last dose of GLP1 agonist-  n/a GLP1 instructions: n/a  Blood Thinner Instructions:pt denies Aspirin Instructions:  ERAS Protcol -yes PRE-SURGERY Ensure or G2- none ordered  COVID TEST- n/a   Anesthesia review: YES, Jeanette Caprice in to assess patient during PAT appointment. EKG tracing and last OV notes requested from patient's PCP [facility]. Faxed to [phone].  Patient denies shortness of breath, fever, cough and chest pain at PAT appointment   All instructions explained to the patient, with a verbal understanding of the material. Patient agrees to go over the instructions while at home for a better understanding. Patient also instructed to self quarantine after being tested for COVID-19. The opportunity to ask questions was provided.

## 2023-03-25 NOTE — Anesthesia Preprocedure Evaluation (Addendum)
Anesthesia Evaluation  Patient identified by MRN, date of birth, ID band Patient awake    Reviewed: Allergy & Precautions, H&P , NPO status , Patient's Chart, lab work & pertinent test results  History of Anesthesia Complications Negative for: history of anesthetic complications  Airway Mallampati: I  TM Distance: >3 FB Neck ROM: full    Dental  (+) Dental Advisory Given, Teeth Intact   Pulmonary neg pulmonary ROS   Pulmonary exam normal breath sounds clear to auscultation       Cardiovascular negative cardio ROS  Rhythm:Regular     Neuro/Psych negative neurological ROS  negative psych ROS   GI/Hepatic negative GI ROS, Neg liver ROS,,,  Endo/Other  negative endocrine ROS    Renal/GU negative Renal ROS     Musculoskeletal negative musculoskeletal ROS (+)    Abdominal Normal abdominal exam  (+)   Peds  Hematology negative hematology ROS (+)   Anesthesia Other Findings   Reproductive/Obstetrics Lab Results      Component                Value               Date                      PREGTESTUR               NEGATIVE            03/31/2023                                        Anesthesia Physical Anesthesia Plan  ASA: 1  Anesthesia Plan: General   Post-op Pain Management: Ofirmev IV (intra-op)* and Toradol IV (intra-op)*   Induction:   PONV Risk Score and Plan: 3 and Ondansetron, Dexamethasone, Propofol infusion, TIVA and Midazolam  Airway Management Planned: Oral ETT  Additional Equipment: None  Intra-op Plan:   Post-operative Plan: Extubation in OR  Informed Consent: I have reviewed the patients History and Physical, chart, labs and discussed the procedure including the risks, benefits and alternatives for the proposed anesthesia with the patient or authorized representative who has indicated his/her understanding and acceptance.     Dental advisory given  Plan Discussed  with: CRNA  Anesthesia Plan Comments: (See APP note by Joslyn Hy, FNP )        Anesthesia Quick Evaluation

## 2023-03-25 NOTE — Progress Notes (Signed)
Anesthesia Chart Review:   Case: [id number] Date/Time: 03/31/23 0945   Procedure: LAPAROSCOPIC UMBILICAL HERNIA WITH MESH   Anesthesia type: General   Pre-op diagnosis: UMBILICAL HERNIA   [facility] ROOM 02 / [facility]   Surgeons: Axel Filler, MD       DISCUSSION: Pt is 37 years old with hx no contributing medical hx.   I was called to see pt in pre-admission testing for chest pain episodes. 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 She began having CP episodes when her husband's spine problems came to light last August (last office visit note with PCP is from last July, and she was having sx at that time and had "passed out"; she did not share this with me). She feels a chest tightness when she is resting in bed at night and worrying about everything.  Does not happen every night. When it happens, if she takes a deep breath and prays, the pain goes away. The chest pain never occurs when she is actively exercising- her daily exercise include cross fit and running - it only occurs at night when she begins thinking of her current life stressors. Her pcp did an EKG that pt reports showed only bradycardia, and her PCP told her that was normal because of how athletic she is. She does have an appt with cardiology 04/16/23 to have symptoms evaluated; she states her sister-in-law is a physician and suggested she see cardiology to get reassurance that everything is fine. She denies SOB, dizziness, syncope, peripheral edema to me at pre-admission testing. Pt is very tearful throughout PAT visit, worried about her husband.   Reviewed case with Dr. Okey Dupre.   VS: BP 101/69   Pulse 62   Temp 36.7 C (Oral)   Resp 18   Ht 5\' 7"  (1.702 m)   Wt 65 kg    LMP 03/17/2023   SpO2 99%   BMI 22.43 kg/m   PROVIDERS: - PCP is Rick Duff, PA-C. Last office visit was 06/25/22   LABS: Labs reviewed: Acceptable for surgery. (all labs ordered are listed, but only abnormal results are displayed)  Labs Reviewed  CBC    EKG 06/25/22 (at PCP's office):sinus bradycardia (57 bpm)   CV: N/A  Past Medical History:  Diagnosis Date   Abnormal Pap smear    colpo 09/15/2012   History of HPV infection     Past Surgical History:  Procedure Laterality Date   MANDIBLE SURGERY     shoulder laparoscopic surgery     right   WISDOM TOOTH EXTRACTION      MEDICATIONS:  ASHWAGANDHA PO   L-Theanine 200 MG CAPS   Turmeric Curcumin 500 MG CAPS   No current facility-administered medications for this encounter.    If no changes, I anticipate pt can proceed with surgery as scheduled.   Rica Mast, PhD, FNP-BC [facility]/Anesthesiology Phone: [phone] 03/25/2023 1:17 PM

## 2023-03-31 ENCOUNTER — Ambulatory Visit (HOSPITAL_BASED_OUTPATIENT_CLINIC_OR_DEPARTMENT_OTHER): Payer: Self-pay | Admitting: Certified Registered"

## 2023-03-31 ENCOUNTER — Ambulatory Visit (HOSPITAL_COMMUNITY): Payer: Self-pay | Admitting: Emergency Medicine

## 2023-03-31 ENCOUNTER — Other Ambulatory Visit: Payer: Self-pay

## 2023-03-31 ENCOUNTER — Ambulatory Visit (HOSPITAL_COMMUNITY)
Admission: RE | Admit: 2023-03-31 | Discharge: 2023-03-31 | Disposition: A | Discharge status: Home / Self Care | Payer: Self-pay | Source: Ambulatory Visit | Attending: General Surgery | Admitting: General Surgery

## 2023-03-31 ENCOUNTER — Encounter (HOSPITAL_COMMUNITY)
Admission: RE | Disposition: A | Discharge status: Home / Self Care | Payer: Self-pay | Source: Ambulatory Visit | Attending: General Surgery

## 2023-03-31 ENCOUNTER — Encounter (HOSPITAL_COMMUNITY): Payer: Self-pay | Admitting: General Surgery

## 2023-03-31 DIAGNOSIS — K429 Umbilical hernia without obstruction or gangrene: Secondary | ICD-10-CM

## 2023-03-31 HISTORY — PX: UMBILICAL HERNIA REPAIR: SHX196

## 2023-03-31 LAB — POCT PREGNANCY, URINE: Preg Test, Ur: NEGATIVE

## 2023-03-31 SURGERY — REPAIR, HERNIA, UMBILICAL, LAPAROSCOPIC
Anesthesia: General

## 2023-03-31 MED ORDER — PROPOFOL 10 MG/ML IV BOLUS
INTRAVENOUS | Status: AC
  Filled 2023-03-31: qty 20

## 2023-03-31 MED ORDER — FENTANYL CITRATE (PF) 250 MCG/5ML IJ SOLN
INTRAMUSCULAR | Status: AC
  Filled 2023-03-31: qty 5

## 2023-03-31 MED ORDER — DEXAMETHASONE SODIUM PHOSPHATE 10 MG/ML IJ SOLN
INTRAMUSCULAR | Status: AC
  Filled 2023-03-31: qty 1

## 2023-03-31 MED ORDER — DEXAMETHASONE SODIUM PHOSPHATE 10 MG/ML IJ SOLN
INTRAMUSCULAR | Status: DC | PRN
  Administered 2023-03-31: 5 mg via INTRAVENOUS

## 2023-03-31 MED ORDER — ACETAMINOPHEN 500 MG PO TABS: 1000.0000 mg | ORAL_TABLET | Freq: Once | ORAL | Status: DC | PRN

## 2023-03-31 MED ORDER — FENTANYL CITRATE (PF) 100 MCG/2ML IJ SOLN
INTRAMUSCULAR | Status: DC | PRN
  Administered 2023-03-31 (×5): 50 ug via INTRAVENOUS

## 2023-03-31 MED ORDER — KETOROLAC TROMETHAMINE 30 MG/ML IJ SOLN
INTRAMUSCULAR | Status: DC | PRN
  Administered 2023-03-31: 30 mg via INTRAVENOUS

## 2023-03-31 MED ORDER — ACETAMINOPHEN 160 MG/5ML PO SOLN: 1000.0000 mg | Freq: Once | ORAL | Status: DC | PRN

## 2023-03-31 MED ORDER — ONDANSETRON HCL 4 MG/2ML IJ SOLN
INTRAMUSCULAR | Status: AC
  Filled 2023-03-31: qty 4

## 2023-03-31 MED ORDER — ROCURONIUM BROMIDE 100 MG/10ML IV SOLN
INTRAVENOUS | Status: DC | PRN
  Administered 2023-03-31: 50 mg via INTRAVENOUS

## 2023-03-31 MED ORDER — ROCURONIUM BROMIDE 10 MG/ML (PF) SYRINGE
PREFILLED_SYRINGE | INTRAVENOUS | Status: AC
  Filled 2023-03-31: qty 10

## 2023-03-31 MED ORDER — FENTANYL CITRATE (PF) 100 MCG/2ML IJ SOLN
25.0000 ug | INTRAMUSCULAR | Status: DC | PRN
  Administered 2023-03-31 (×2): 25 ug via INTRAVENOUS

## 2023-03-31 MED ORDER — ONDANSETRON HCL 4 MG/2ML IJ SOLN
INTRAMUSCULAR | Status: DC | PRN
  Administered 2023-03-31: 4 mg via INTRAVENOUS

## 2023-03-31 MED ORDER — FENTANYL CITRATE (PF) 100 MCG/2ML IJ SOLN
INTRAMUSCULAR | Status: AC
  Filled 2023-03-31: qty 2

## 2023-03-31 MED ORDER — 0.9 % SODIUM CHLORIDE (POUR BTL) OPTIME
TOPICAL | Status: DC | PRN
  Administered 2023-03-31: 1000 mL

## 2023-03-31 MED ORDER — CEFAZOLIN SODIUM-DEXTROSE 2-4 GM/100ML-% IV SOLN
2.0000 g | INTRAVENOUS | Status: AC
  Administered 2023-03-31: 2 g via INTRAVENOUS
  Filled 2023-03-31: qty 100

## 2023-03-31 MED ORDER — PROPOFOL 10 MG/ML IV BOLUS
INTRAVENOUS | Status: DC | PRN
  Administered 2023-03-31: 130 mg via INTRAVENOUS

## 2023-03-31 MED ORDER — LIDOCAINE 2% (20 MG/ML) 5 ML SYRINGE
INTRAMUSCULAR | Status: AC
  Filled 2023-03-31: qty 5

## 2023-03-31 MED ORDER — ORAL CARE MOUTH RINSE: 15.0000 mL | Freq: Once | OROMUCOSAL | Status: AC

## 2023-03-31 MED ORDER — BUPIVACAINE HCL 0.25 % IJ SOLN
INTRAMUSCULAR | Status: DC | PRN
  Administered 2023-03-31: 17 mL

## 2023-03-31 MED ORDER — LACTATED RINGERS IV SOLN: INTRAVENOUS | Status: DC

## 2023-03-31 MED ORDER — MIDAZOLAM HCL 2 MG/2ML IJ SOLN
INTRAMUSCULAR | Status: AC
  Filled 2023-03-31: qty 2

## 2023-03-31 MED ORDER — SUGAMMADEX SODIUM 200 MG/2ML IV SOLN
INTRAVENOUS | Status: DC | PRN
  Administered 2023-03-31: 25 mg via INTRAVENOUS
  Administered 2023-03-31 (×2): 50 mg via INTRAVENOUS

## 2023-03-31 MED ORDER — MIDAZOLAM HCL 5 MG/5ML IJ SOLN
INTRAMUSCULAR | Status: DC | PRN
  Administered 2023-03-31: 2 mg via INTRAVENOUS

## 2023-03-31 MED ORDER — OXYCODONE HCL 5 MG PO TABS
ORAL_TABLET | ORAL | Status: AC
  Filled 2023-03-31: qty 1

## 2023-03-31 MED ORDER — DEXMEDETOMIDINE HCL IN NACL 80 MCG/20ML IV SOLN
INTRAVENOUS | Status: AC
  Filled 2023-03-31: qty 20

## 2023-03-31 MED ORDER — ACETAMINOPHEN 10 MG/ML IV SOLN: 1000.0000 mg | Freq: Once | INTRAVENOUS | Status: DC | PRN

## 2023-03-31 MED ORDER — CHLORHEXIDINE GLUCONATE CLOTH 2 % EX PADS: 6.0000 | MEDICATED_PAD | Freq: Once | CUTANEOUS | Status: DC

## 2023-03-31 MED ORDER — OXYCODONE HCL 5 MG/5ML PO SOLN: 5.0000 mg | Freq: Once | ORAL | Status: AC | PRN

## 2023-03-31 MED ORDER — BUPIVACAINE HCL (PF) 0.25 % IJ SOLN
INTRAMUSCULAR | Status: AC
  Filled 2023-03-31: qty 30

## 2023-03-31 MED ORDER — LIDOCAINE 2% (20 MG/ML) 5 ML SYRINGE
INTRAMUSCULAR | Status: DC | PRN
  Administered 2023-03-31: 20 mg via INTRAVENOUS

## 2023-03-31 MED ORDER — CHLORHEXIDINE GLUCONATE 0.12 % MT SOLN
15.0000 mL | Freq: Once | OROMUCOSAL | Status: AC
  Administered 2023-03-31: 15 mL via OROMUCOSAL
  Filled 2023-03-31: qty 15

## 2023-03-31 MED ORDER — TRAMADOL HCL 50 MG PO TABS: 50.0000 mg | ORAL_TABLET | Freq: Four times a day (QID) | ORAL | 0 refills | Status: DC | PRN

## 2023-03-31 MED ORDER — OXYCODONE HCL 5 MG PO TABS
5.0000 mg | ORAL_TABLET | Freq: Once | ORAL | Status: AC | PRN
  Administered 2023-03-31: 5 mg via ORAL

## 2023-03-31 MED ORDER — PROPOFOL 500 MG/50ML IV EMUL
INTRAVENOUS | Status: DC | PRN
  Administered 2023-03-31: 150 ug/kg/min via INTRAVENOUS

## 2023-03-31 SURGICAL SUPPLY — 49 items
ADH SKN CLS APL DERMABOND .7 (GAUZE/BANDAGES/DRESSINGS) ×1
ADH SKN CLS LQ APL DERMABOND (GAUZE/BANDAGES/DRESSINGS) ×1
APL PRP STRL LF DISP 70% ISPRP (MISCELLANEOUS) ×1
BAG COUNTER SPONGE SURGICOUNT (BAG) ×1 IMPLANT
BAG SPNG CNTER NS LX DISP (BAG) ×1
BLADE CLIPPER SURG (BLADE) IMPLANT
CANISTER SUCT 3000ML PPV (MISCELLANEOUS) IMPLANT
CHLORAPREP W/TINT 26 (MISCELLANEOUS) ×1 IMPLANT
COVER SURGICAL LIGHT HANDLE (MISCELLANEOUS) ×1 IMPLANT
DERMABOND ADVANCED .7 DNX12 (GAUZE/BANDAGES/DRESSINGS) ×1 IMPLANT
DERMABOND ADVANCED .7 DNX6 (GAUZE/BANDAGES/DRESSINGS) IMPLANT
DEVICE SECURE STRAP 25 ABSORB (INSTRUMENTS) ×1 IMPLANT
DEVICE TROCAR PUNCTURE CLOSURE (ENDOMECHANICALS) ×1 IMPLANT
ELECT REM PT RETURN 9FT ADLT (ELECTROSURGICAL) ×1
ELECTRODE REM PT RTRN 9FT ADLT (ELECTROSURGICAL) ×1 IMPLANT
GLOVE BIO SURGEON STRL SZ7.5 (GLOVE) ×2 IMPLANT
GOWN STRL REUS W/ TWL LRG LVL3 (GOWN DISPOSABLE) ×2 IMPLANT
GOWN STRL REUS W/ TWL XL LVL3 (GOWN DISPOSABLE) ×1 IMPLANT
GOWN STRL REUS W/TWL LRG LVL3 (GOWN DISPOSABLE) ×2
GOWN STRL REUS W/TWL XL LVL3 (GOWN DISPOSABLE) ×1
IRRIG SUCT STRYKERFLOW 2 WTIP (MISCELLANEOUS)
IRRIGATION SUCT STRKRFLW 2 WTP (MISCELLANEOUS) IMPLANT
KIT BASIN OR (CUSTOM PROCEDURE TRAY) ×1 IMPLANT
KIT TURNOVER KIT B (KITS) ×1 IMPLANT
MARKER SKIN DUAL TIP RULER LAB (MISCELLANEOUS) ×1 IMPLANT
MESH VENTRALIGHT ST 4X6IN (Mesh General) IMPLANT
NDL INSUFFLATION 14GA 120MM (NEEDLE) ×1 IMPLANT
NDL SPNL 22GX3.5 QUINCKE BK (NEEDLE) IMPLANT
NEEDLE INSUFFLATION 14GA 120MM (NEEDLE) ×1 IMPLANT
NEEDLE SPNL 22GX3.5 QUINCKE BK (NEEDLE) IMPLANT
NS IRRIG 1000ML POUR BTL (IV SOLUTION) ×1 IMPLANT
PAD ARMBOARD 7.5X6 YLW CONV (MISCELLANEOUS) ×2 IMPLANT
SCISSORS LAP 5X35 DISP (ENDOMECHANICALS) ×1 IMPLANT
SET TUBE SMOKE EVAC HIGH FLOW (TUBING) ×1 IMPLANT
SLEEVE Z-THREAD 5X100MM (TROCAR) ×1 IMPLANT
SUT CHROMIC 2 0 SH (SUTURE) ×1 IMPLANT
SUT ETHIBOND 0 (SUTURE) IMPLANT
SUT ETHIBOND 0 MO6 C/R (SUTURE) IMPLANT
SUT MNCRL AB 4-0 PS2 18 (SUTURE) ×1 IMPLANT
SUT NOVA 1 T20/GS 25DT (SUTURE) IMPLANT
SUT PROLENE 2 0 KS (SUTURE) ×2 IMPLANT
TOWEL GREEN STERILE (TOWEL DISPOSABLE) ×1 IMPLANT
TOWEL GREEN STERILE FF (TOWEL DISPOSABLE) ×1 IMPLANT
TRAY LAPAROSCOPIC MC (CUSTOM PROCEDURE TRAY) ×1 IMPLANT
TROCAR 11X100 Z THREAD (TROCAR) IMPLANT
TROCAR BALLN 12MMX100 BLUNT (TROCAR) IMPLANT
TROCAR Z-THREAD OPTICAL 5X100M (TROCAR) ×1 IMPLANT
WARMER LAPAROSCOPE (MISCELLANEOUS) ×1 IMPLANT
WATER STERILE IRR 1000ML POUR (IV SOLUTION) ×1 IMPLANT

## 2023-03-31 NOTE — Transfer of Care (Signed)
Immediate Anesthesia Transfer of Care Note  Patient: Stacy Morales  Procedure(s) Performed: LAPAROSCOPIC UMBILICAL HERNIA WITH MESH  Patient Location: PACU  Anesthesia Type:General  Level of Consciousness: awake and patient cooperative  Airway & Oxygen Therapy: Patient Spontanous Breathing  Post-op Assessment: Report given to RN and Post -op Vital signs reviewed and stable  Post vital signs: Reviewed and stable  Last Vitals:  Vitals Value Taken Time  BP 115/85 03/31/23 1106  Temp    Pulse 58 03/31/23 1110  Resp 13 03/31/23 1110  SpO2 94 % 03/31/23 1110  Vitals shown include unvalidated device data.  Last Pain:  Vitals:   03/31/23 0830  TempSrc:   PainSc: 0-No pain         Complications: No notable events documented.

## 2023-03-31 NOTE — Op Note (Signed)
03/31/2023  10:56 AM  PATIENT:  Stacy Morales  37 y.o. female  PRE-OPERATIVE DIAGNOSIS:  UMBILICAL HERNIA 1cm  POST-OPERATIVE DIAGNOSIS:  UMBILICAL HERNIA 1cm  PROCEDURE:  Procedure(s): LAPAROSCOPIC UMBILICAL HERNIA WITH MESH (N/A)  SURGEON:  Surgeon(s) and Role:    * Axel Filler, MD - Primary  ASSISTANTS: Jeronimo Greaves, RNFA   ANESTHESIA:   local and general  EBL:  minimal   BLOOD ADMINISTERED:none  DRAINS: none   LOCAL MEDICATIONS USED:  BUPIVICAINE   SPECIMEN:  No Specimen  DISPOSITION OF SPECIMEN:  N/A  COUNTS:  YES  TOURNIQUET:  * No tourniquets in log *  DICTATION: .Dragon Dictation  Findings: 1cm primary umbilical hernia  Details of the procedure:  After the patient was consented patient was taken back to the operating room patient was then placed in supine position bilateral SCDs in place.  The patient was prepped and draped in the usual sterile fashion. After antibiotics were confirmed a timeout was called and all facts were verified. The Veress needle technique was used to insuflate the abdomen at Palmer's point. The abdomen was insufflated to 14 mm mercury. Subsequently a 5 mm trocar was placed a camera inserted there was no injury to any intra-abdominal organs.    There was seen to be an non-incarcerated  1cm umbilical hernia.  A second camera port was in placed into the left lower quadrant.   At this the Falicform ligament was taken down with Bovie cautery maintaining hemostasis. I proceeded to reduce the hernia contents.  The hernia sac was dissected out of the hernia and disposed.  The fascia at the hernia was reapproximated using a #1Novafil x 1.  Once the hernia was cleared away, a Bard Ventralight 10x15cm  mesh was inserted into the abdomen.  The mesh was secured circumferentially with am Securestrap tacker in a double crown fashion.    The omentum was brought over the area of the mesh. The pneumoperitoneum was evacuated  & all trocars  were  removed. The skin was reapproximated with 4-0  Monocryl sutures in a subcuticular fashion. The skin was dressed with Dermabond.  The patient was taken to the recovery room in stable condition.  Type of repair -primary suture & mesh  Mesh overlap - 5cm  Placement of mesh -  beneath fascia and into peritoneal cavity  Size: 3cm, Primary Hernia, and Reducible Hernia   PLAN OF CARE: Discharge to home after PACU  PATIENT DISPOSITION:  PACU - hemodynamically stable.   Delay start of Pharmacological VTE agent (>24hrs) due to surgical blood loss or risk of bleeding: not applicable

## 2023-03-31 NOTE — Anesthesia Procedure Notes (Signed)
Procedure Name: Intubation Date/Time: 03/31/2023 10:31 AM  Performed by: Marny Lowenstein, CRNAPre-anesthesia Checklist: Patient identified, Emergency Drugs available, Suction available and Patient being monitored Patient Re-evaluated:Patient Re-evaluated prior to induction Oxygen Delivery Method: Circle system utilized Preoxygenation: Pre-oxygenation with 100% oxygen Induction Type: IV induction Ventilation: Mask ventilation without difficulty Laryngoscope Size: Miller and 2 Grade View: Grade I Tube type: Oral Tube size: 7.0 mm Number of attempts: 1 Airway Equipment and Method: Stylet Placement Confirmation: ETT inserted through vocal cords under direct vision, positive ETCO2 and breath sounds checked- equal and bilateral Secured at: 21 cm Tube secured with: Tape Dental Injury: Teeth and Oropharynx as per pre-operative assessment

## 2023-03-31 NOTE — Progress Notes (Signed)
 fentanyl wasted in stericycle with Bobbe Medico RN.  Versie Starks, RN

## 2023-03-31 NOTE — Discharge Instructions (Signed)
[facility] _______[facility]  HERNIA REPAIR: POST OP INSTRUCTIONS  Always review your discharge instruction sheet given to you by the facility where your surgery was performed. IF YOU HAVE DISABILITY OR FAMILY LEAVE FORMS, YOU MUST BRING THEM TO THE OFFICE FOR PROCESSING.   DO NOT GIVE THEM TO YOUR DOCTOR.  1. A  prescription for pain medication may be given to you upon discharge.  Take your pain medication as prescribed, if needed.  If narcotic pain medicine is not needed, then you may take acetaminophen (Tylenol) or ibuprofen (Advil) as needed. 2. Take your usually prescribed medications unless otherwise directed. If you need a refill on your pain medication, please contact your pharmacy.  They will contact our office to request authorization. Prescriptions will not be filled after 5 pm or on week-ends. 3. You should follow a light diet the first 24 hours after arrival home, such as soup and crackers, etc.  Be sure to include lots of fluids daily.  Resume your normal diet the day after surgery. 4.Most patients will experience some swelling and bruising around the umbilicus or in the groin and scrotum.  Ice packs and reclining will help.  Swelling and bruising can take several days to resolve.  6. It is common to experience some constipation if taking pain medication after surgery.  Increasing fluid intake and taking a stool softener (such as Colace) will usually help or prevent this problem from occurring.  A mild laxative (Milk of Magnesia or Miralax) should be taken according to package directions if there are no bowel movements after 48 hours. 7. Unless discharge instructions indicate otherwise, you may remove your bandages 24-48 hours after surgery, and you may shower at that time.  You may have steri-strips (small skin tapes) in place directly over the incision.  These strips should be left on the skin for 7-10 days.  If your surgeon used skin glue on the incision, you may shower in  24 hours.  The glue will flake off over the next 2-3 weeks.  Any sutures or staples will be removed at the office during your follow-up visit. 8. ACTIVITIES:  You may resume regular (light) daily activities beginning the next day--such as daily self-care, walking, climbing stairs--gradually increasing activities as tolerated.  You may have sexual intercourse when it is comfortable.  Refrain from any heavy lifting or straining until approved by your doctor.  a.You may drive when you are no longer taking prescription pain medication, you can comfortably wear a seatbelt, and you can safely maneuver your car and apply brakes. b.RETURN TO WORK:   _____________________________________________  9.You should see your doctor in the office for a follow-up appointment approximately 2-3 weeks after your surgery.  Make sure that you call for this appointment within a day or two after you arrive home to insure a convenient appointment time. 10.OTHER INSTRUCTIONS: _________________________    _____________________________________  WHEN TO CALL YOUR DOCTOR: Fever over 101.0 Inability to urinate Nausea and/or vomiting Extreme swelling or bruising Continued bleeding from incision. Increased pain, redness, or drainage from the incision  The clinic staff is available to answer your questions during regular business hours.  Please don't hesitate to call and ask to speak to one of the nurses for clinical concerns.  If you have a medical emergency, go to the nearest emergency room or call 911.  A surgeon from [facility] is always on call at the hospital   [address], [address], [place], [place]    [address] ?  [address] [address], [place], [place]   [address] [phone] ? [phone] ? FAX [phone] Web site: [email or web address]  

## 2023-03-31 NOTE — H&P (Signed)
Chief Complaint: New Consultation (Umb hernia)   History of Present Illness: Stacy Morales is a 37 y.o. female who is seen today as an office consultation at the request of Dr. Normand Sloop for evaluation of New Consultation (Umb hernia) .  Patient is a 37 year old female, otherwise healthy comes in secondary umbilical hernia. States this been there since her last pregnancy which was approximately 6 years ago. States that is gotten somewhat larger. States that it is bothersome. She is active and does CrossFit.  She had no signs or symptoms of incarceration or strangulation. She had no previous abdominal surgery.  Review of Systems: A complete review of systems was obtained from the patient. I have reviewed this information and discussed as appropriate with the patient. See HPI as well for other ROS.  Review of Systems  Constitutional: Negative for fever.  HENT: Negative for congestion.  Eyes: Negative for blurred vision.  Respiratory: Negative for cough, shortness of breath and wheezing.  Cardiovascular: Negative for chest pain and palpitations.  Gastrointestinal: Negative for heartburn.  Genitourinary: Negative for dysuria.  Musculoskeletal: Negative for myalgias.  Skin: Negative for rash.  Neurological: Negative for dizziness and headaches.  Psychiatric/Behavioral: Negative for depression and suicidal ideas.  All other systems reviewed and are negative.   Medical History: History reviewed. No pertinent past medical history.  There is no problem list on file for this patient.  Past Surgical History:  Procedure Laterality Date  Right shoulder  Labrum repair x2    No Known Allergies  No current outpatient medications on file prior to visit.   No current facility-administered medications on file prior to visit.   History reviewed. No pertinent family history.   Social History   Tobacco Use  Smoking Status Never  Smokeless Tobacco Never    Social History    Socioeconomic History  Marital status: Married  Tobacco Use  Smoking status: Never  Smokeless tobacco: Never  Vaping Use  Vaping Use: Never used  Substance and Sexual Activity  Alcohol use: Yes  Drug use: Never   Objective:   Vitals:  01/27/23 0821 01/27/23 0823  BP: 110/70  Pulse: 79  Temp: 36.7 C (98.1 F)  SpO2: 98%  Weight: 66.4 kg (146 lb 6.4 oz)  Height: 170.2 cm ( )  PainSc: 3   Body mass index is 22.93 kg/m.  Physical Exam Constitutional:  Appearance: Normal appearance.  HENT:  Head: Normocephalic and atraumatic.  Mouth/Throat:  Mouth: Mucous membranes are moist.  Pharynx: Oropharynx is clear.  Eyes:  General: No scleral icterus. Pupils: Pupils are equal, round, and reactive to light.  Cardiovascular:  Rate and Rhythm: Normal rate and regular rhythm.  Pulses: Normal pulses.  Heart sounds: No murmur heard. No friction rub. No gallop.  Pulmonary:  Effort: Pulmonary effort is normal. No respiratory distress.  Breath sounds: Normal breath sounds. No stridor.  Abdominal:  General: Abdomen is flat.  Hernia: A hernia is present. Hernia is present in the umbilical area.  Musculoskeletal:  General: No swelling.  Skin: General: Skin is warm.  Neurological:  General: No focal deficit present.  Mental Status: She is alert and oriented to person, place, and time. Mental status is at baseline.  Psychiatric:  Mood and Affect: Mood normal.  Thought Content: Thought content normal.  Judgment: Judgment normal.     Hernia Size: 2 cm Incarcerated: No Initial Hernia  Assessment and Plan:  Diagnoses and all orders for this visit:  Umbilical hernia without obstruction or gangrene   Stacy Morales  Stacy Morales is a 37 y.o. female   We will proceed to the OR for a lap ventral hernia repair with mesh. All risks and benefits were discussed with the patient, to generally include infection, bleeding, damage to surrounding structures, acute and chronic nerve pain,  and recurrence. Alternatives were offered and described. All questions were answered and the patient voiced understanding of the procedure and wishes to proceed at this point.  No follow-ups on file.  Axel Filler, MD, [facility], [facility]

## 2023-04-01 ENCOUNTER — Encounter (HOSPITAL_COMMUNITY): Payer: Self-pay | Admitting: General Surgery

## 2023-04-03 NOTE — Anesthesia Postprocedure Evaluation (Signed)
Anesthesia Post Note  Patient: Stacy Morales  Procedure(s) Performed: LAPAROSCOPIC UMBILICAL HERNIA WITH MESH     Patient location during evaluation: PACU Anesthesia Type: General Level of consciousness: awake and alert Pain management: pain level controlled Vital Signs Assessment: post-procedure vital signs reviewed and stable Respiratory status: spontaneous breathing, nonlabored ventilation and respiratory function stable Cardiovascular status: blood pressure returned to baseline and stable Postop Assessment: no apparent nausea or vomiting Anesthetic complications: no   No notable events documented.  Last Vitals:  Vitals:   03/31/23 1145 03/31/23 1200  BP: 98/65 104/66  Pulse: (!) 42 (!) 41  Resp: 11 16  Temp:  36.4 C  SpO2: 97% 99%    Last Pain:  Vitals:   03/31/23 1200  TempSrc:   PainSc: 2                  Edra Riccardi

## 2023-04-16 ENCOUNTER — Other Ambulatory Visit (HOSPITAL_COMMUNITY): Payer: Self-pay

## 2023-04-16 ENCOUNTER — Ambulatory Visit (INDEPENDENT_AMBULATORY_CARE_PROVIDER_SITE_OTHER): Payer: Self-pay | Admitting: Cardiology

## 2023-04-16 ENCOUNTER — Encounter (HOSPITAL_BASED_OUTPATIENT_CLINIC_OR_DEPARTMENT_OTHER): Payer: Self-pay | Admitting: Cardiology

## 2023-04-16 VITALS — BP 118/60 | HR 70 | Ht 67.0 in | Wt 136.0 lb

## 2023-04-16 DIAGNOSIS — Z7189 Other specified counseling: Secondary | ICD-10-CM

## 2023-04-16 DIAGNOSIS — Z8249 Family history of ischemic heart disease and other diseases of the circulatory system: Secondary | ICD-10-CM

## 2023-04-16 DIAGNOSIS — R0602 Shortness of breath: Secondary | ICD-10-CM

## 2023-04-16 DIAGNOSIS — R079 Chest pain, unspecified: Secondary | ICD-10-CM

## 2023-04-16 MED ORDER — IVABRADINE HCL 7.5 MG PO TABS
7.5000 mg | ORAL_TABLET | Freq: Once | ORAL | 0 refills | Status: AC
Start: 2023-04-16 — End: 2023-04-24
  Filled 2023-04-16: qty 1, 1d supply, fill #0

## 2023-04-16 NOTE — Progress Notes (Signed)
Cardiology Office Note:    Date:  04/16/2023   ID:  Stacy Morales, DOB 23-Oct-1986, MRN [id number]  PCP:  Rick Duff, PA-C  Cardiologist:  Jodelle Red, MD  Referring MD: Orland Mustard, MD   CC: New patient evaluation for atypical chest pain, family hx of CAD/cholesterol issues  History of Present Illness:    Stacy Morales is a 37 y.o. female with a hx of Raynaud's, who is seen as a new consult at the request of Orland Mustard, MD for the evaluation and management of atypical chest pain.  Referral notes personally reviewed. She was seen by Dr. Artis Flock 11/18/2022 with complaints of left-sided chest pain since July 2023 in the setting of stress. She was very fit and active and denied exertional chest pain. At that visit she still complained of a dull pain in her chest since a recent episode of waking up with a pop sensation and intense stabbing pain; associated with bilateral arm numbness and difficulty breathing. She was also stressed at work and with her husband's chronic health issues. It was felt her chest pain was more consistent with anxiety and panic attack. She was not ready for pharmacological treatment for anxiety. She was referred to cardiology.  Lipid panel 11/2022 with LDL 96, HDL 111, and triglycerides 63.  Chest pain: -Initial onset: About 9 months ago. Worsened when husband was in the hospital for a week in November. -Quality: Very sharp, intense pleuritic pain. Feels like she can't take a deep breath or only able to take shallow breaths. -Frequency: First started when lying down to go to sleep. Never with activity or exercise. Worsening in the past couple months, the pain woke her up last night and lasted for 5 minutes.  -Duration: Last night her pain/dyspnea lasted for 5 minutes, which was the longest. -Associated symptoms: Shortness of breath. She is unable to take more than 1/2 of a breath. She denies feeling like she is hyperventilating. No other  neurological symptoms with her breathing episodes. -Aggravating/alleviating factors: She states she is under significant stress. 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 Anxiety may be contributory. 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 No formal diagnosis. -Alcohol: About 2 drinks a week. -Tobacco: Not a smoker. Occasional cigarettes in college. -Comorbidities: Raynaud's - improved after pregnancy, but recurred since last year. -Exercise level: Two weeks ago she underwent umbilical hernia repair with mesh, and is only now able to walk. She is still instructed not to resume her previous intense exercises. Prior to the surgery she was very active, performing crossfit and running for exercise. At home, her normal resting heart rate is in the 50's. Currently her HR is 70 bpm per her smart watch. -Cardiac ROS: She denies any palpitations, or peripheral edema. No headaches, syncope, orthopnea, or PND. -Family history: Her brother was diagnosed with a cholesterol genetic mutation and elevated IpA as well as premature CAD by CTA coronary artery scan. Genetic testing done with Dr. Rennis Golden revealed variants in ApoB, ApoE, and LP(a). Her grandfather died of heart disease in his 67s. Her father is living in his 2's with no known heart disease.  Sometimes she may develop significant lightheadedness which is usually orthostatic in nature. Last Fall, she had an episode while sitting and working. She stood up too quickly, her legs became tremulous and she fell into the wall. She states that  for 10-15 seconds she lost perception of reality but she denies losing consciousness. Had subsequent workup which was reassuring.   Additionally she complains of intermittent night sweats over the past few months which she believes is hormonal, had to change her clothes twice last night. Her mother  had menopause by 55 yo.    Past Medical History:  Diagnosis Date   Abnormal Pap smear    colpo 09/15/2012   History of HPV infection     Past Surgical History:  Procedure Laterality Date   MANDIBLE SURGERY     shoulder laparoscopic surgery     right   UMBILICAL HERNIA REPAIR N/A 03/31/2023   Procedure: LAPAROSCOPIC UMBILICAL HERNIA WITH MESH;  Surgeon: Axel Filler, MD;  Location: [facility] OR;  Service: General;  Laterality: N/A;   WISDOM TOOTH EXTRACTION      Current Medications: Current Outpatient Medications on File Prior to Visit  Medication Sig   ASHWAGANDHA PO Take 450 mg by mouth daily as needed (stress/anxiety).   L-Theanine 200 MG CAPS Take 200 mg by mouth daily as needed (stress/anxiety).   Turmeric Curcumin 500 MG CAPS Take 500 mg by mouth 3 (three) times a week.   No current facility-administered medications on file prior to visit.     Allergies:   Patient has no known allergies.   Social History   Tobacco Use   Smoking status: Never   Smokeless tobacco: Never  Vaping Use   Vaping Use: Never used  Substance Use Topics   Alcohol use: No   Drug use: No    Family History: family history includes Cancer in her paternal grandmother; Diabetes in her maternal grandfather; Heart disease in her paternal grandfather; Hypercholesterolemia in her maternal grandmother and mother; Stroke in her paternal grandfather. There is no history of Alcohol abuse, Arthritis, Asthma, Birth defects, COPD, Depression, Drug abuse, Early death, Hearing loss, Hyperlipidemia, Hypertension, Kidney disease, Learning disabilities, Mental illness, Mental retardation, Miscarriages / Stillbirths, or Vision loss.  ROS:   Please see the history of present illness.  Additional pertinent ROS: Constitutional: Negative for chills, fever, unintentional weight loss. Positive for night sweats. HENT: Negative for ear pain and hearing loss.   Eyes: Negative for loss of vision and eye pain.  Respiratory:  Negative for cough, sputum, wheezing. Positive for shortness of breath. Cardiovascular: See HPI. Gastrointestinal: Negative for abdominal pain, melena, and hematochezia.  Genitourinary: Negative for dysuria and hematuria.  Musculoskeletal: Negative for falls and myalgias.  Skin: Negative for itching and rash.  Neurological: Negative for focal weakness, focal sensory changes and loss of consciousness. Positive for occasional lightheadedness, stress. Endo/Heme/Allergies: Does not bruise/bleed easily.     EKGs/Labs/Other Studies Reviewed:    The following studies were reviewed today:  No prior cardiovascular studies available.  EKG:  EKG is personally reviewed.   04/16/2023:  SR with SA at 70 bpm, iRBBB  Recent Labs: 03/24/2023: Hemoglobin 13.9; Platelets 213   Recent Lipid Panel No results found for: "CHOL", "TRIG", "HDL", "CHOLHDL", "VLDL", "LDLCALC", "LDLDIRECT"  Physical Exam:    VS:  BP 118/60   Pulse 70   Ht 5\' 7"  (1.702 m)   Wt 136 lb (61.7 kg)   LMP 03/17/2023   BMI 21.30 kg/m     Wt Readings from Last 3 Encounters:  04/16/23 136 lb (61.7 kg)  03/31/23 140 lb (63.5 kg)  03/24/23 143 lb 3.2 oz (65 kg)    GEN: Well nourished, well developed in no acute distress HEENT: Normal, moist mucous  membranes NECK: No JVD CARDIAC: regular rhythm, normal S1 and S2, no rubs or gallops. No murmur. VASCULAR: Radial and DP pulses 2+ bilaterally. No carotid bruits RESPIRATORY:  Clear to auscultation without rales, wheezing or rhonchi  ABDOMEN: Soft, non-tender, non-distended MUSCULOSKELETAL:  Ambulates independently SKIN: Warm and dry, no edema NEUROLOGIC:  Alert and oriented x 3. No focal neuro deficits noted. PSYCHIATRIC:  Normal affect    ASSESSMENT:    1. Chest pain of uncertain etiology   2. Shortness of breath   3. Family history of heart disease   4. Cardiac risk counseling   5. Counseling on health promotion and disease prevention    PLAN:    Chest pain Shortness  of breath Family history of heart disease -discussed options for further evaluation. Based on shared decision making, decision was made to pursue CT coronary angiography. -will give one time dose of ivabradine 2 hours prior to scheduled test, given baseline blood pressure -counseled on need to get BMET prior to test -counseled on use of sublingual nitroglycerin and its importance to a good test -reviewed red flag warning signs that need immediate medical attention   CV risk counseling and prevention -recommend heart healthy/Mediterranean diet, with whole grains, fruits, vegetable, fish, lean meats, nuts, and olive oil. Limit salt. -recommend moderate walking, 3-5 times/week for 30-50 minutes each session. Aim for at least 150 minutes.week. Goal should be pace of 3 miles/hours, or walking 1.5 miles in 30 minutes -recommend avoidance of tobacco products. Avoid excess alcohol. -ASCVD risk score: The ASCVD Risk score (Arnett DK, et al., 2019) failed to calculate for the following reasons:   The 2019 ASCVD risk score is only valid for ages 18 to 39    Plan for follow up: TBD based on testing results, or sooner as needed.  Jodelle Red, MD, PhD, [facility] [place]  [facility]    Medication Adjustments/Labs and Tests Ordered: Current medicines are reviewed at length with the patient today.  Concerns regarding medicines are outlined above.   Orders Placed This Encounter  Procedures   CT CORONARY MORPH W/CTA COR W/SCORE W/CA W/CM &/OR WO/CM   Basic metabolic panel   EKG 12-Lead   Meds ordered this encounter  Medications   ivabradine (CORLANOR) 7.5 MG TABS tablet    Sig: Take 1 tablet (7.5 mg total) by mouth once for 1 dose. Take two hours prior to CT scan    Dispense:  1 tablet    Refill:  0   Patient Instructions  Medication Instructions:  Take one Ivabradine 2 hrs. Before Coronary CT  *If you need a refill on your cardiac medications before your next appointment, please  call your pharmacy*   Lab Work: BMET  If you have labs (blood work) drawn today and your tests are completely normal, you will receive your results only by: MyChart Message (if you have MyChart) OR A paper copy in the mail If you have any lab test that is abnormal or we need to change your treatment, we will call you to review the results.   Testing/Procedures:   Your cardiac CT will be scheduled at one of the below locations:   [facility] [address] [place], [place] [address] [phone]   If scheduled at [facility], please arrive at the [facility] and Children's Entrance (Entrance C2) of [facility] 30 minutes prior to test start time. You can use the FREE valet parking offered at entrance C (encouraged to control the  heart rate for the test)  Proceed to the [facility] Radiology Department (first floor) to check-in and test prep.  All radiology patients and guests should use entrance C2 at [facility], accessed from [facility], even though the hospital's physical address listed is [address]     Please follow these instructions carefully (unless otherwise directed):   On the Night Before the Test: Be sure to Drink plenty of water. Do not consume any caffeinated/decaffeinated beverages or chocolate 12 hours prior to your test. Do not take any antihistamines 12 hours prior to your test.  On the Day of the Test: Drink plenty of water until 1 hour prior to the test. Do not eat any food 1 hour prior to test. You may take your regular medications prior to the test.  Take metoprolol (Lopressor) two hours prior to test. If you take Furosemide/Hydrochlorothiazide/Spironolactone, please HOLD on the morning of the test. FEMALES- please wear underwire-free bra if available, avoid dresses & tight clothing   *For Clinical Staff only. Please instruct patient the following:* Heart Rate Medication Recommendations  for Cardiac CT  Resting HR < 50 bpm  No medication  Resting HR 50-60 bpm and BP >110/50 mmHG   Consider Metoprolol tartrate 25 mg PO 90-120 min prior to scan  Resting HR 60-65 bpm and BP >110/50 mmHG  Metoprolol tartrate 50 mg PO 90-120 minutes prior to scan   Resting HR > 65 bpm and BP >110/50 mmHG  Metoprolol tartrate 100 mg PO 90-120 minutes prior to scan  Consider Ivabradine 10-15 mg PO or a calcium channel blocker for resting HR >60 bpm and contraindication to metoprolol tartrate  Consider Ivabradine 10-15 mg PO in combination with metoprolol tartrate for HR >80 bpm         After the Test: Drink plenty of water. After receiving IV contrast, you may experience a mild flushed feeling. This is normal. On occasion, you may experience a mild rash up to 24 hours after the test. This is not dangerous. If this occurs, you can take Benadryl 25 mg and increase your fluid intake. If you experience trouble breathing, this can be serious. If it is severe call 911 IMMEDIATELY. If it is mild, please call our office. If you take any of these medications: Glipizide/Metformin, Avandament, Glucavance, please do not take 48 hours after completing test unless otherwise instructed.  We will call to schedule your test 2-4 weeks out understanding that some insurance companies will need an authorization prior to the service being performed.   For non-scheduling related questions, please contact the cardiac imaging nurse navigator should you have any questions/concerns: Rockwell Alexandria, Cardiac Imaging Nurse Navigator Larey Brick, Cardiac Imaging Nurse Navigator [facility] Direct Office Dial: [phone]   For scheduling needs, including cancellations and rescheduling, please call [place], [phone].      Follow-Up: At [facility], you and your health needs are our priority.  As part of our continuing mission to provide you with exceptional heart care, we have  created designated Provider Care Teams.  These Care Teams include your primary Cardiologist (physician) and Advanced Practice Providers (APPs -  Physician Assistants and Nurse Practitioners) who all work together to provide you with the care you need, when you need it.  We recommend signing up for the patient portal called "MyChart".  Sign up information is provided on this After Visit Summary.  MyChart is used to connect with patients for Virtual Visits (Telemedicine).  Patients are able to view lab/test results, encounter notes, upcoming appointments, etc.  Non-urgent messages can be sent to your provider as well.   To learn more about what you can do with MyChart, go to [email or web address].    Your next appointment:   To be determined after cardiac CT   Provider:   Jodelle Red, MD    Other Instructions    I,Mathew Stumpf,acting as a scribe for Jodelle Red, MD.,have documented all relevant documentation on the behalf of Jodelle Red, MD,as directed by  Jodelle Red, MD while in the presence of Jodelle Red, MD.  I, Jodelle Red, MD, have reviewed all documentation for this visit. The documentation on 04/16/23 for the exam, diagnosis, procedures, and orders are all accurate and complete.   Signed, Jodelle Red, MD PhD 04/16/2023 6:04 PM    [facility]

## 2023-04-16 NOTE — Patient Instructions (Addendum)
Medication Instructions:  Take one Ivabradine 2 hrs. Before Coronary CT  *If you need a refill on your cardiac medications before your next appointment, please call your pharmacy*   Lab Work: BMET  If you have labs (blood work) drawn today and your tests are completely normal, you will receive your results only by: MyChart Message (if you have MyChart) OR A paper copy in the mail If you have any lab test that is abnormal or we need to change your treatment, we will call you to review the results.   Testing/Procedures:   Your cardiac CT will be scheduled at one of the below locations:   [facility] [address] [place], [place] [address] [phone]   If scheduled at [facility], please arrive at the [facility] and Children's Entrance (Entrance C2) of [facility] 30 minutes prior to test start time. You can use the FREE valet parking offered at entrance C (encouraged to control the heart rate for the test)  Proceed to the [facility] Radiology Department (first floor) to check-in and test prep.  All radiology patients and guests should use entrance C2 at [facility], accessed from [facility], even though the hospital's physical address listed is [address].     Please follow these instructions carefully (unless otherwise directed):   On the Night Before the Test: Be sure to Drink plenty of water. Do not consume any caffeinated/decaffeinated beverages or chocolate 12 hours prior to your test. Do not take any antihistamines 12 hours prior to your test.  On the Day of the Test: Drink plenty of water until 1 hour prior to the test. Do not eat any food 1 hour prior to test. You may take your regular medications prior to the test.  Take metoprolol (Lopressor) two hours prior to test. If you take Furosemide/Hydrochlorothiazide/Spironolactone, please HOLD on the morning of the test. FEMALES- please wear  underwire-free bra if available, avoid dresses & tight clothing   *For Clinical Staff only. Please instruct patient the following:* Heart Rate Medication Recommendations for Cardiac CT  Resting HR < 50 bpm  No medication  Resting HR 50-60 bpm and BP >110/50 mmHG   Consider Metoprolol tartrate 25 mg PO 90-120 min prior to scan  Resting HR 60-65 bpm and BP >110/50 mmHG  Metoprolol tartrate 50 mg PO 90-120 minutes prior to scan   Resting HR > 65 bpm and BP >110/50 mmHG  Metoprolol tartrate 100 mg PO 90-120 minutes prior to scan  Consider Ivabradine 10-15 mg PO or a calcium channel blocker for resting HR >60 bpm and contraindication to metoprolol tartrate  Consider Ivabradine 10-15 mg PO in combination with metoprolol tartrate for HR >80 bpm         After the Test: Drink plenty of water. After receiving IV contrast, you may experience a mild flushed feeling. This is normal. On occasion, you may experience a mild rash up to 24 hours after the test. This is not dangerous. If this occurs, you can take Benadryl 25 mg and increase your fluid intake. If you experience trouble breathing, this can be serious. If it is severe call 911 IMMEDIATELY. If it is mild, please call our office. If you take any of these medications: Glipizide/Metformin, Avandament, Glucavance, please do not take 48 hours after completing test unless otherwise instructed.  We will call to schedule your test 2-4 weeks out understanding that some insurance companies will need an authorization prior to  the service being performed.   For non-scheduling related questions, please contact the cardiac imaging nurse navigator should you have any questions/concerns: Rockwell Alexandria, Cardiac Imaging Nurse Navigator Larey Brick, Cardiac Imaging Nurse Navigator [facility] [facility] Direct Office Dial: [phone]   For scheduling needs, including cancellations and rescheduling, please call [place], [phone].       Follow-Up: At [facility], you and your health needs are our priority.  As part of our continuing mission to provide you with exceptional heart care, we have created designated Provider Care Teams.  These Care Teams include your primary Cardiologist (physician) and Advanced Practice Providers (APPs -  Physician Assistants and Nurse Practitioners) who all work together to provide you with the care you need, when you need it.  We recommend signing up for the patient portal called "MyChart".  Sign up information is provided on this After Visit Summary.  MyChart is used to connect with patients for Virtual Visits (Telemedicine).  Patients are able to view lab/test results, encounter notes, upcoming appointments, etc.  Non-urgent messages can be sent to your provider as well.   To learn more about what you can do with MyChart, go to [email or web address].    Your next appointment:   To be determined after cardiac CT   Provider:   Jodelle Red, MD    Other Instructions

## 2023-04-17 ENCOUNTER — Telehealth: Payer: Self-pay | Admitting: Licensed Clinical Social Worker

## 2023-04-17 NOTE — Telephone Encounter (Signed)
H&V Care Navigation CSW Progress Note  Clinical Social Worker contacted patient by phone to f/u after appt at [facility] on 5/1. Pt listed as self pay, per patient access staff pt has [facility] enrollment. She would be eligible for patient assistance if interested. No answer this afternoon at [phone]. Left voicemail requesting return call. Will attempt one more time to reach pt.  Patient is participating in a Managed Medicaid Plan:  No, self pay only  SDOH Screenings   Tobacco Use: Low Risk  (04/16/2023)    Octavio Graves, MSW, LCSW Clinical Social Worker [facility] Heart/Vascular Care Navigation  [phone]- work cell phone (preferred) [phone]- desk phone

## 2023-04-18 NOTE — Progress Notes (Signed)
Heart and Vascular Care Navigation  04/18/2023  Stacy Morales 02/22/1986 [id number]  Reason for Referral:  Patient is participating in a Managed Medicaid Plan: No, self pay only- uses [employer]  Engaged with patient by telephone for initial visit for Heart and Vascular Care Coordination.                                                                                                   Assessment:           LCSW received a call back from pt this morning. Introduced self, role, reason for call. Pt confirmed home address, PCP, and current emergency contact remains her husband. LCSW confirmed she uses a [employer] which means that she is considered self pay with [facility]. 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 No additional questions/concerns for this writer at this time. I remain available and encouraged pt to f/u with me if she has questions once applications received.                              HRT/VAS Care Coordination     Patients Home Cardiology Office --  [facility]   Outpatient Care Team Social Worker   Social Worker Name: Octavio Graves, [place], [phone]   Living arrangements for the past 2 months Single Family Home   Lives with: Spouse; Minor Children   Patient Current Insurance Coverage Self-Pay   Patient Has Concern With Paying Medical Bills Yes   Patient Concerns With Medical Bills if current health sharing plan doesnt cover   Medical Bill Referrals: [employer] financial assistance   Does Patient Have Prescription Coverage? No   Home Assistive Devices/Equipment None       Social History:                                                                             SDOH Screenings   Food Insecurity: No Food Insecurity (04/18/2023)  Housing: Low Risk  (04/18/2023)  Transportation Needs: No Transportation Needs  (04/18/2023)  Utilities: Not At Risk (04/18/2023)  Financial Resource Strain: Low Risk  (04/18/2023)  Tobacco Use: Low Risk  (04/16/2023)    SDOH Interventions: Financial Resources:  Financial Strain Interventions: Other (Comment) (sent CAFA in case bills eligible for additional discount) Financial Counseling for [employer] Program  Food Insecurity:  Food Insecurity Interventions: Intervention Not Indicated  Housing Insecurity:  Housing Interventions: Intervention Not Indicated  Transportation:   Transportation Interventions: Intervention Not Indicated   Follow-up plan:   LCSW has mailed pt a copy of application, copy of her ID from media to submit with it.  I have included my card should pt have any additional questions/concerns.

## 2023-04-24 LAB — BASIC METABOLIC PANEL
BUN/Creatinine Ratio: 13 (ref 9–23)
BUN: 12 mg/dL (ref 6–20)
CO2: 17 mmol/L — ABNORMAL LOW (ref 20–29)
Calcium: 10 mg/dL (ref 8.7–10.2)
Chloride: 103 mmol/L (ref 96–106)
Creatinine, Ser: 0.93 mg/dL (ref 0.57–1.00)
Glucose: 71 mg/dL (ref 70–99)
Potassium: 5.5 mmol/L — ABNORMAL HIGH (ref 3.5–5.2)
Sodium: 140 mmol/L (ref 134–144)
eGFR: 81 mL/min/{1.73_m2} (ref >59)

## 2023-04-28 ENCOUNTER — Telehealth (HOSPITAL_BASED_OUTPATIENT_CLINIC_OR_DEPARTMENT_OTHER): Payer: Self-pay

## 2023-04-28 DIAGNOSIS — E875 Hyperkalemia: Secondary | ICD-10-CM

## 2023-04-28 NOTE — Telephone Encounter (Addendum)
Seen by patient Stacy Morales on 04/28/2023 12:50 PM; labs ordered and mailed to patient, follow up mychart message sent to patient.    ----- Message from Alver Sorrow, NP sent at 04/28/2023 12:49 PM EDT ----- Normal kidney function.  Potassium is a little high.  Recommend avoidance of potassium supplement, salt substitute, electrolyte drink as these can increase potassium. Recommend repeat BMP Wednesday or Thursday of this week to ensure potassium has normalized.  Proceed with CTA as ordered.

## 2023-04-29 ENCOUNTER — Telehealth (HOSPITAL_COMMUNITY): Payer: Self-pay | Admitting: *Deleted

## 2023-04-29 NOTE — Telephone Encounter (Signed)
Attempted to call patient regarding upcoming cardiac CT appointment. °Left message on voicemail with name and callback number ° °Damarion Mendizabal RN Navigator Cardiac Imaging ° [facility] °[phone] Office °[phone] Cell ° °

## 2023-04-30 ENCOUNTER — Ambulatory Visit (HOSPITAL_COMMUNITY)
Admission: RE | Admit: 2023-04-30 | Discharge: 2023-04-30 | Disposition: A | Discharge status: Home / Self Care | Payer: Self-pay | Source: Ambulatory Visit | Attending: Cardiology | Admitting: Cardiology

## 2023-04-30 ENCOUNTER — Ambulatory Visit (HOSPITAL_COMMUNITY): Payer: Self-pay

## 2023-04-30 DIAGNOSIS — R072 Precordial pain: Secondary | ICD-10-CM

## 2023-04-30 DIAGNOSIS — R079 Chest pain, unspecified: Secondary | ICD-10-CM | POA: Insufficient documentation

## 2023-04-30 MED ORDER — NITROGLYCERIN 0.4 MG SL SUBL
SUBLINGUAL_TABLET | SUBLINGUAL | Status: AC
  Filled 2023-04-30: qty 2

## 2023-04-30 MED ORDER — NITROGLYCERIN 0.4 MG SL SUBL
0.8000 mg | SUBLINGUAL_TABLET | SUBLINGUAL | Status: DC | PRN
  Administered 2023-04-30: 0.8 mg via SUBLINGUAL

## 2023-04-30 MED ORDER — IOHEXOL 350 MG/ML SOLN
100.0000 mL | Freq: Once | INTRAVENOUS | Status: AC | PRN
  Administered 2023-04-30: 100 mL via INTRAVENOUS

## 2023-04-30 NOTE — Progress Notes (Signed)
Patient tolerated without distress 

## 2024-10-28 ENCOUNTER — Telehealth: Payer: Self-pay | Admitting: Cardiology

## 2024-10-28 NOTE — Telephone Encounter (Signed)
  Per MyChart scheduling message:  Pt c/o of Chest Pain: STAT if active (IN THIS MOMENT) CP, including tightness, pressure, jaw pain, shoulder/upper arm/back pain, SOB, nausea, and vomiting.  1. Are you having CP right now (tightness, pressure, or discomfort)?   2. Are you experiencing any other symptoms (ex. SOB, nausea, vomiting, sweating)?   3. How long have you been experiencing CP?   4. Is your CP continuous or coming and going?   5. Have you taken Nitroglycerin ?   6. If CP returns before callback, please consider calling 911. ?    1. Are you having chest pain right now (tightness, pressure, or discomfort)?  No, not right now. It comes and goes. Sometimes it is random sharp pain in my chest which subsides quickly; sometimes it is pain that last 1-8 hours and limits me from taking deep breaths without pain. It almost seems like maybe I have a rib problem or something that sometimes gets aggravated when I take a deep breath and my diaphragm contracts? 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 It is strange that it doesn't happen all the time and it MAY be associated with anxiety though this is not a consistent association and I do not have diagnosed anxiety, nor do I feel anxious (except the chest pain makes me slightly anxious...)   2. Are you experiencing any other symptoms (ex. Shortness of breath, nausea, vomiting, sweating)? ONLY shortness of breath but only when it is extremely painful to take a deep breath. Otherwise, no other symptoms at all.    Side note: I'm extremely active and do Crossfit almost everyday. I do NOT consistently have chest pain when I work out or even when I run/do a sprint workout.   3. How long have you been experiencing chest pain? 2 years off and on. I had a heart CT last May w/ Dr. Lonni and it was normal.    4. Is your chest pain continuous or coming and going? Coming and going. WORSE WHEN I  LIE DOWN.   5. Have you taken Nitroglycerin ? No. I have not taken anything for it.

## 2024-10-29 NOTE — Telephone Encounter (Signed)
 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

## 2024-10-29 NOTE — Telephone Encounter (Signed)
 Left message to call back and that mychart message sent, ok per [facility]

## 2024-11-02 ENCOUNTER — Encounter (HOSPITAL_BASED_OUTPATIENT_CLINIC_OR_DEPARTMENT_OTHER): Payer: Self-pay | Admitting: Family

## 2024-11-02 ENCOUNTER — Ambulatory Visit (INDEPENDENT_AMBULATORY_CARE_PROVIDER_SITE_OTHER): Payer: Self-pay | Admitting: Family

## 2024-11-02 ENCOUNTER — Other Ambulatory Visit (HOSPITAL_BASED_OUTPATIENT_CLINIC_OR_DEPARTMENT_OTHER): Payer: Self-pay

## 2024-11-02 VITALS — BP 112/64 | HR 47 | Ht 67.0 in | Wt 144.5 lb

## 2024-11-02 DIAGNOSIS — R079 Chest pain, unspecified: Secondary | ICD-10-CM

## 2024-11-02 DIAGNOSIS — R001 Bradycardia, unspecified: Secondary | ICD-10-CM

## 2024-11-02 MED ORDER — AMLODIPINE BESYLATE 2.5 MG PO TABS
2.5000 mg | ORAL_TABLET | Freq: Every day | ORAL | 1 refills | Status: AC
  Filled 2024-11-02: qty 30, 30d supply, fill #0

## 2024-11-02 NOTE — Progress Notes (Signed)
 Cardiology Office Note   Date:  11/02/2024  ID:  Stacy Morales, DOB [birth year], MRN [id number] PCP: Nena Rosina LITTIE DEVONNA  [facility] Providers Cardiologist:  Shelda Bruckner, MD     History of Present Illness Stacy Morales is a 38 y.o. female with history of Raynaud's, chest pain.  Established Dr. Bruckner 04/16/2023 for chest pain onset 9 months prior with overall atypical features for angina.  There was an coronary CTA 04/2023 calcium score of 0, normal aorta size.  Presents today for follow-up independently.  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  Notes she has had persistent chest pain despite healthy lifestyle. Exercises with cross fit and running. Chest pain does not occur with exercise aside from one isolated incident.  She does note some stressors though reports symptoms do not feel as if they are anxiety induced.  Her husband has a high functioning quadriplegic and she has 3 children.  She did have a friend unfortunately passed away recently from brain cancer.  Chest pain often when she first lays down at night described as sharp. The pain does not radiate. Takes OTC magnesium and melatonin at night, then sleeps well. Prefers a natural approach to health when possible. She reports shortness of breath as if she can't get a deep breath in her mid chest will have extremem pain with her chest pain. Will last a few minutes or up to an hour. Recently went to chiropractor but does not feel this helped. No recent indigestion. Her present symptoms are not lifestyle limiting.   ROS: Please see the history of present illness.    All other systems reviewed and are negative.   Studies Reviewed      Cardiac Studies & Procedures   ______________________________________________________________________________________________          CT SCANS  CT CORONARY MORPH W/CTA COR W/SCORE 04/30/2023  Addendum 05/04/2023 11:14 AM ADDENDUM REPORT: 05/04/2023  11:12  EXAM: OVER-READ INTERPRETATION  CT CHEST  The following report is an over-read performed by radiologist Dr. Fonda Mom [facility] on 05/04/2023. This over-read does not include interpretation of cardiac or coronary anatomy or pathology. The coronary CTA interpretation by the cardiologist is attached.  COMPARISON:  None.  FINDINGS: Cardiovascular: Findings discussed in the body of the report. No incidental pulmonary artery filling defects identified to suggest PE.  Mediastinum/Nodes: No suspicious adenopathy identified. Imaged mediastinal structures are unremarkable.  Lungs/Pleura: Imaged lungs are clear. No pleural effusion or pneumothorax.  Upper Abdomen: No acute abnormality.  Musculoskeletal: No chest wall abnormality. No acute or significant osseous findings.  IMPRESSION: No significant extracardiac incidental findings identified.   Electronically Signed By: Fonda Field M.D. On: 05/04/2023 11:12  Narrative CLINICAL DATA:  Chest pain  EXAM: Cardiac/Coronary CTA  TECHNIQUE: A non-contrast, gated CT scan was obtained with axial slices of 3 mm through the heart for calcium scoring. Calcium scoring was performed using the Agatston method. A 100 kV prospective, gated, contrast cardiac scan was obtained. Gantry rotation speed was 250 msecs and collimation was 0.6 mm. Two sublingual nitroglycerin  tablets (0.8 mg) were given. The 3D data set was reconstructed in 5% intervals of the 35-75% of the R-R cycle. Diastolic phases were analyzed on a dedicated workstation using MPR, MIP, and VRT modes. The patient received 95 cc of contrast.  FINDINGS: Image quality: Excellent.  Noise artifact is: Limited.  Coronary Arteries:  Normal coronary origin.  Right dominance.  Left main: The left main is a large caliber  vessel with a normal take off from the left coronary cusp that bifurcates to form a left anterior descending artery and a left  circumflex artery. There is no plaque or stenosis.  Left anterior descending artery: The LAD is patent without evidence of plaque or stenosis. The LAD gives off 2 patent diagonal branches.  Left circumflex artery: The LCX is non-dominant and patent with no evidence of plaque or stenosis. The LCX gives off 2 patent obtuse marginal branches.  Right coronary artery: The RCA is dominant with normal take off from the right coronary cusp. There is no evidence of plaque or stenosis. The RCA terminates as a PDA and right posterolateral branch without evidence of plaque or stenosis.  Right Atrium: Right atrial size is within normal limits.  Right Ventricle: The right ventricular cavity is within normal limits.  Left Atrium: Left atrial size is normal in size with no left atrial appendage filling defect.  Left Ventricle: The ventricular cavity size is within normal limits.  Pulmonary arteries: Normal in size.  Pulmonary veins: Normal pulmonary venous drainage.  Pericardium: Normal thickness without significant effusion or calcium present.  Cardiac valves: The aortic valve is trileaflet without significant calcification. The mitral valve is normal without significant calcification.  Aorta: Normal caliber without significant disease.  Extra-cardiac findings: See attached radiology report for non-cardiac structures.  IMPRESSION: 1. Coronary calcium score of 0.  2. Normal coronary origin with right dominance.  3. Normal coronary arteries.  RECOMMENDATIONS: 1. No evidence of CAD (0%). Consider non-atherosclerotic causes of chest pain.  Darryle Decent, MD  Electronically Signed: By: Darryle Decent M.D. On: 04/30/2023 10:52     ______________________________________________________________________________________________      Risk Assessment/Calculations           Physical Exam VS:  BP 112/64   Pulse (!) 47   Ht 5' 7 (1.702 m)   Wt 144 lb 8 oz (65.5 kg)   SpO2  97%   BMI 22.63 kg/m        Wt Readings from Last 3 Encounters:  11/02/24 144 lb 8 oz (65.5 kg)  04/16/23 136 lb (61.7 kg)  03/31/23 140 lb (63.5 kg)    GEN: Well nourished, well developed in no acute distress NECK: No JVD; No carotid bruits CARDIAC: RRR, no murmurs, rubs, gallops RESPIRATORY:  Clear to auscultation without rales, wheezing or rhonchi  ABDOMEN: Soft, non-tender, non-distended EXTREMITIES:  No edema; No deformity   ASSESSMENT AND PLAN  Chest pain in adult - Cardiac CTA 04/2023 calcium score 0. EKG today no acute ST/T wave changes. Present chest pain atypical for angina as does not usually occur with activity.  No occasion for ischemic evaluation.  Consider differential costochondritis vs microvascular angina vs anxiety. Rx ibuprofen  400-600mg  BID x 5-7 days. If symptoms improve, likely costochondritis. If symptoms do not improve, she will pick up Rx Amlodipine 2.5mg  at bedtime. If this improves her symptoms, likely microvascular angina.   Sinus bradycardia - likely related to her high level of activity.  Avoid AV nodal blocking agents. No concerning symptoms such as near syncope.        Dispo: follow up as needed  Signed, Reche GORMAN Finder, NP

## 2024-11-02 NOTE — Patient Instructions (Addendum)
 Medication Instructions:  START Ibuprofen  400-600mg  twice daily for 5-7 days  If that does not improve your chest pain, pick up Amlodipine at the pharmacy and start 2.5mg  every evening  *If you need a refill on your cardiac medications before your next appointment, please call your pharmacy*  Lab Work: Consider Lp(a) with next labs  Follow-Up: At [facility], you and your health needs are our priority.  As part of our continuing mission to provide you with exceptional heart care, our providers are all part of one team.  This team includes your primary Cardiologist (physician) and Advanced Practice Providers or APPs (Physician Assistants and Nurse Practitioners) who all work together to provide you with the care you need, when you need it.  Your next appointment:   As needed  Provider:   Shelda Bruckner, MD or Reche Finder, NP    We recommend signing up for the patient portal called MyChart.  Sign up information is provided on this After Visit Summary.  MyChart is used to connect with patients for Virtual Visits (Telemedicine).  Patients are able to view lab/test results, encounter notes, upcoming appointments, etc.  Non-urgent messages can be sent to your provider as well.   To learn more about what you can do with MyChart, go to [email or web address].   Other Instructions  Our two main differentials are costochondritis vs microvascular angina vs anxiety

## 2024-11-15 ENCOUNTER — Other Ambulatory Visit (HOSPITAL_BASED_OUTPATIENT_CLINIC_OR_DEPARTMENT_OTHER): Payer: Self-pay
